# Patient Record
Sex: Male | Born: 1955 | Race: White | Hispanic: No | Marital: Married | Smoking: Never smoker
Health system: Southern US, Community
[De-identification: ages and names within clinical notes are randomized; demographics above are authoritative.]

## PROBLEM LIST (undated history)

## (undated) DIAGNOSIS — J329 Chronic sinusitis, unspecified: Secondary | ICD-10-CM

## (undated) DIAGNOSIS — R011 Cardiac murmur, unspecified: Secondary | ICD-10-CM

## (undated) DIAGNOSIS — J45909 Unspecified asthma, uncomplicated: Secondary | ICD-10-CM

## (undated) DIAGNOSIS — Z87442 Personal history of urinary calculi: Secondary | ICD-10-CM

## (undated) HISTORY — PX: FUNCTIONAL ENDOSCOPIC SINUS SURGERY: SUR616

## (undated) HISTORY — PX: COLONOSCOPY: SHX174

## (undated) HISTORY — PX: TONSILLECTOMY: SUR1361

## (undated) HISTORY — PX: OTHER SURGICAL HISTORY: SHX169

## (undated) HISTORY — DX: Unspecified asthma, uncomplicated: J45.909

## (undated) HISTORY — PX: NASAL SEPTUM SURGERY: SHX37

---

## 2006-06-27 ENCOUNTER — Emergency Department: Payer: Self-pay | Admitting: Unknown Physician Specialty

## 2006-08-03 ENCOUNTER — Ambulatory Visit: Payer: Self-pay | Admitting: Gastroenterology

## 2013-11-08 ENCOUNTER — Ambulatory Visit: Payer: Self-pay | Admitting: Unknown Physician Specialty

## 2014-10-18 ENCOUNTER — Emergency Department: Payer: Self-pay | Admitting: Emergency Medicine

## 2018-06-22 DIAGNOSIS — M51369 Other intervertebral disc degeneration, lumbar region without mention of lumbar back pain or lower extremity pain: Secondary | ICD-10-CM | POA: Insufficient documentation

## 2018-06-22 DIAGNOSIS — M5136 Other intervertebral disc degeneration, lumbar region: Secondary | ICD-10-CM | POA: Insufficient documentation

## 2018-07-23 ENCOUNTER — Other Ambulatory Visit: Payer: Self-pay | Admitting: Orthopedic Surgery

## 2018-07-23 DIAGNOSIS — M5442 Lumbago with sciatica, left side: Principal | ICD-10-CM

## 2018-07-23 DIAGNOSIS — G8929 Other chronic pain: Secondary | ICD-10-CM

## 2018-07-23 DIAGNOSIS — M5136 Other intervertebral disc degeneration, lumbar region: Secondary | ICD-10-CM

## 2018-08-16 ENCOUNTER — Ambulatory Visit
Admission: RE | Admit: 2018-08-16 | Discharge: 2018-08-16 | Disposition: A | Discharge status: Home / Self Care | Payer: BLUE CROSS/BLUE SHIELD | Source: Ambulatory Visit | Attending: Orthopedic Surgery | Admitting: Orthopedic Surgery

## 2018-08-16 DIAGNOSIS — M5442 Lumbago with sciatica, left side: Secondary | ICD-10-CM | POA: Diagnosis present

## 2018-08-16 DIAGNOSIS — G8929 Other chronic pain: Secondary | ICD-10-CM | POA: Insufficient documentation

## 2018-08-16 DIAGNOSIS — M5136 Other intervertebral disc degeneration, lumbar region: Secondary | ICD-10-CM | POA: Diagnosis present

## 2018-08-16 DIAGNOSIS — M47816 Spondylosis without myelopathy or radiculopathy, lumbar region: Secondary | ICD-10-CM | POA: Diagnosis not present

## 2018-09-26 ENCOUNTER — Other Ambulatory Visit: Payer: Self-pay | Admitting: Physical Medicine and Rehabilitation

## 2018-09-26 DIAGNOSIS — M545 Low back pain, unspecified: Secondary | ICD-10-CM

## 2018-09-26 DIAGNOSIS — G8929 Other chronic pain: Secondary | ICD-10-CM

## 2018-10-02 ENCOUNTER — Ambulatory Visit
Admission: RE | Admit: 2018-10-02 | Discharge: 2018-10-02 | Disposition: A | Discharge status: Home / Self Care | Payer: BLUE CROSS/BLUE SHIELD | Source: Ambulatory Visit | Attending: Physical Medicine and Rehabilitation | Admitting: Physical Medicine and Rehabilitation

## 2018-10-02 DIAGNOSIS — G8929 Other chronic pain: Secondary | ICD-10-CM

## 2018-10-02 DIAGNOSIS — M545 Low back pain: Principal | ICD-10-CM

## 2018-10-02 MED ORDER — IOPAMIDOL (ISOVUE-M 200) INJECTION 41%
1.0000 mL | Freq: Once | INTRAMUSCULAR | Status: AC
  Administered 2018-10-02: 1 mL via EPIDURAL

## 2018-10-02 MED ORDER — METHYLPREDNISOLONE ACETATE 40 MG/ML INJ SUSP (RADIOLOG
120.0000 mg | Freq: Once | INTRAMUSCULAR | Status: AC
  Administered 2018-10-02: 120 mg via EPIDURAL

## 2018-10-02 NOTE — Discharge Instructions (Signed)

## 2019-05-09 DIAGNOSIS — R200 Anesthesia of skin: Secondary | ICD-10-CM | POA: Insufficient documentation

## 2019-05-23 DIAGNOSIS — M79601 Pain in right arm: Secondary | ICD-10-CM | POA: Insufficient documentation

## 2019-09-02 ENCOUNTER — Ambulatory Visit
Admission: RE | Admit: 2019-09-02 | Discharge: 2019-09-02 | Disposition: A | Discharge status: Home / Self Care | Payer: BC Managed Care – PPO | Source: Ambulatory Visit | Attending: Unknown Physician Specialty | Admitting: Unknown Physician Specialty

## 2019-09-02 ENCOUNTER — Other Ambulatory Visit: Payer: Self-pay

## 2019-09-02 ENCOUNTER — Other Ambulatory Visit: Payer: Self-pay | Admitting: Unknown Physician Specialty

## 2019-09-02 DIAGNOSIS — R05 Cough: Secondary | ICD-10-CM

## 2019-09-02 DIAGNOSIS — R059 Cough, unspecified: Secondary | ICD-10-CM

## 2019-09-04 ENCOUNTER — Ambulatory Visit: Payer: BC Managed Care – PPO | Attending: Internal Medicine

## 2019-09-04 DIAGNOSIS — Z20822 Contact with and (suspected) exposure to covid-19: Secondary | ICD-10-CM

## 2019-09-05 LAB — NOVEL CORONAVIRUS, NAA: SARS-CoV-2, NAA: NOT DETECTED

## 2019-10-01 ENCOUNTER — Ambulatory Visit: Payer: BC Managed Care – PPO | Attending: Internal Medicine

## 2019-10-01 DIAGNOSIS — Z20822 Contact with and (suspected) exposure to covid-19: Secondary | ICD-10-CM

## 2019-10-02 LAB — NOVEL CORONAVIRUS, NAA: SARS-CoV-2, NAA: NOT DETECTED

## 2019-11-14 ENCOUNTER — Other Ambulatory Visit: Payer: Self-pay

## 2019-11-14 ENCOUNTER — Other Ambulatory Visit
Admission: RE | Admit: 2019-11-14 | Discharge: 2019-11-14 | Disposition: A | Discharge status: Home / Self Care | Payer: BC Managed Care – PPO | Attending: Internal Medicine | Admitting: Internal Medicine

## 2019-11-14 ENCOUNTER — Encounter: Payer: Self-pay | Admitting: Internal Medicine

## 2019-11-14 ENCOUNTER — Ambulatory Visit (INDEPENDENT_AMBULATORY_CARE_PROVIDER_SITE_OTHER): Payer: BC Managed Care – PPO | Admitting: Internal Medicine

## 2019-11-14 VITALS — BP 156/80 | HR 100 | Temp 98.7°F | Ht 66.0 in | Wt 147.2 lb

## 2019-11-14 DIAGNOSIS — J309 Allergic rhinitis, unspecified: Secondary | ICD-10-CM | POA: Insufficient documentation

## 2019-11-14 DIAGNOSIS — H1010 Acute atopic conjunctivitis, unspecified eye: Secondary | ICD-10-CM

## 2019-11-14 DIAGNOSIS — J4540 Moderate persistent asthma, uncomplicated: Secondary | ICD-10-CM

## 2019-11-14 LAB — CBC WITH DIFFERENTIAL/PLATELET
Abs Immature Granulocytes: 0.06 10*3/uL (ref 0.00–0.07)
Basophils Absolute: 0.1 10*3/uL (ref 0.0–0.1)
Basophils Relative: 1 %
Eosinophils Absolute: 0.6 10*3/uL — ABNORMAL HIGH (ref 0.0–0.5)
Eosinophils Relative: 8 %
HCT: 43.3 % (ref 39.0–52.0)
Hemoglobin: 14.8 g/dL (ref 13.0–17.0)
Immature Granulocytes: 1 %
Lymphocytes Relative: 25 %
Lymphs Abs: 1.9 10*3/uL (ref 0.7–4.0)
MCH: 30 pg (ref 26.0–34.0)
MCHC: 34.2 g/dL (ref 30.0–36.0)
MCV: 87.8 fL (ref 80.0–100.0)
Monocytes Absolute: 0.6 10*3/uL (ref 0.1–1.0)
Monocytes Relative: 8 %
Neutro Abs: 4.4 10*3/uL (ref 1.7–7.7)
Neutrophils Relative %: 57 %
Platelets: 281 10*3/uL (ref 150–400)
RBC: 4.93 MIL/uL (ref 4.22–5.81)
RDW: 12.3 % (ref 11.5–15.5)
WBC: 7.7 10*3/uL (ref 4.0–10.5)
nRBC: 0 % (ref 0.0–0.2)

## 2019-11-14 MED ORDER — IPRATROPIUM BROMIDE 0.03 % NA SOLN: 2.0000 | Freq: Four times a day (QID) | NASAL | 1 refills | Status: DC

## 2019-11-14 MED ORDER — MONTELUKAST SODIUM 10 MG PO TABS: 10.0000 mg | ORAL_TABLET | Freq: Every day | ORAL | 2 refills | Status: DC

## 2019-11-14 NOTE — Progress Notes (Signed)
Name: Micheal Carr MRN: [id number] DOB: 02-24-1956     CONSULTATION DATE: 11/14/2019  REFERRING MD : Kary Kos  CHIEF COMPLAINT: cough  STUDIES:     CXR independently reviewed by Me 09/02/2019 No acute process No pneumonia No opacifications no effusions    HISTORY OF PRESENT ILLNESS: 64 year old pleasant white male seen today for signs symptoms of moderate persistent asthma Patient has had chronic sinus infections his whole life Patient also has chronic allergic rhinitis Status post nasal surgery 5 years ago Patient averaged 2-5 chronic sinus infections per year Currently he is down to 2 infections per year  In the last 3 months patient has had 3 rounds of antibiotics and 2 rounds of prednisone Patient has nocturnal wheezing Patient has nocturnal cough Albuterol helps a little Patient uses Vicks VapoRub that helps as well as cough medicine  Patient has restarted his Symbicort inhaler which has helped his symptoms At this time he still has chronic persistent allergic rhinitis and symptoms  10 years ago he used to take Zyrtec which has helped to some numbness tremendously Patient stopped taking Zyrtec in fear of developing Alzheimer's  No ASTHMA exacerbation at this time No evidence of heart failure at this time No evidence or signs of infection at this time No respiratory distress No fevers, chills, nausea, vomiting, diarrhea No evidence of lower extremity edema No evidence hemoptysis  No indication for prednisone No indication for antibiotics at this time   PAST MEDICAL HISTORY :   has a past medical history of Asthma.  has no past surgical history on file. Prior to Admission medications   Medication Sig Start Date End Date Taking? Authorizing Provider  albuterol (VENTOLIN HFA) 108 (90 Base) MCG/ACT inhaler Inhale into the lungs. 06/06/18  Yes [provider]  ascorbic Acid (VITAMIN C) 500 MG CPCR Take by mouth.   Yes [provider]    budesonide-formoterol (SYMBICORT) 160-4.5 MCG/ACT inhaler    Yes [provider]  Fluticasone Propionate Truett Perna) 93 MCG/ACT [place]  10/03/19  Yes [provider]  Multiple Vitamin (MULTI-VITAMIN) tablet Take by mouth.   Yes [provider]  omeprazole (PRILOSEC) 40 MG capsule Take by mouth. 04/13/14  Yes [provider]  pseudoephedrine (SUDAFED) 120 MG 12 hr tablet Take by mouth.   Yes [provider]   Allergies  Allergen Reactions  . Levofloxacin Other (See Comments)    Patient states his ENT gave him this med and he had "a little something kind of reaction" and was told not to take it again.   . Sulfa Antibiotics Itching    FAMILY HISTORY:  family history includes Alzheimer's disease in his mother; Cancer in his mother; Dementia in his father; Heart disease in his mother; Kidney disease in his mother; Stroke in his father. SOCIAL HISTORY:  reports that he has never smoked. He has never used smokeless tobacco.    Review of Systems:  Gen:  Denies  fever, sweats, chills weigh loss  HEENT: Denies blurred vision, double vision, ear pain, eye pain, hearing loss, nose bleeds, sore throat Cardiac:  No dizziness, chest pain or heaviness, chest tightness,edema, No JVD Resp:   No cough, -sputum production, -shortness of breath,-wheezing, -hemoptysis,  Gi: Denies swallowing difficulty, stomach pain, nausea or vomiting, diarrhea, constipation, bowel incontinence Gu:  Denies bladder incontinence, burning urine Ext:   Denies Joint pain, stiffness or swelling Skin: Denies  skin rash, easy bruising or bleeding or hives Endoc:  Denies polyuria, polydipsia , polyphagia or  weight change Psych:   Denies depression, insomnia or hallucinations  Other:  All other systems negative   BP (!) 156/80 (BP Location: Left Arm, Cuff Size: Large)   Pulse 100   Temp 98.7 F (37.1 C) (Temporal)   Ht 5\' 6"  (1.676 m)   Wt 147 lb 3.2 oz (66.8 kg)   SpO2 98%   BMI  23.76 kg/m      SpO2: 98 % O2 Device: None (Room air)    Physical Examination:   GENERAL:NAD, no fevers, chills, no weakness no fatigue HEAD: Normocephalic, atraumatic.  EYES: PERLA, EOMI No scleral icterus.  MOUTH: Moist mucosal membrane.  EAR, NOSE, THROAT: Clear without exudates. No external lesions.  NECK: Supple.  PULMONARY: CTA B/L no wheezing, rhonchi, crackles CARDIOVASCULAR: S1 and S2. Regular rate and rhythm. No murmurs GASTROINTESTINAL: Soft, nontender, nondistended. Positive bowel sounds.  MUSCULOSKELETAL: No swelling, clubbing, or edema.  NEUROLOGIC: No gross focal neurological deficits. 5/5 strength all extremities SKIN: No ulceration, lesions, rashes, or cyanosis.  PSYCHIATRIC: Insight, judgment intact. -depression -anxiety ALL OTHER ROS ARE NEGATIVE   MEDICATIONS: I have reviewed all medications and confirmed regimen as documented      ASSESSMENT AND PLAN SYNOPSIS 64 year old pleasant white male seen today for chronic allergic rhinitis with moderate persistent asthma with ongoing symptoms with a history of chronic sinus infections status post nasal surgery  At this time I do believe he has moderate persistent asthma that is causing his symptoms most likely related to uncontrolled chronic allergic rhinitis I had a discussion about restarting antihistamines with Zyrtec as this has helped tremendously in the past Patient states he is afraid of developing Alzheimer's disease from chronic usage I have recommended that starting Zyrtec may be the best option at this time and will also continue Symbicort as well as starting Singulair and using Atrovent nasal sprays at this time No indication for prednisone or antibiotics at this time I will also check CBC and IgE levels to assess for immunological therapy   TOTAL TIME 42 mins  COVID-19 EDUCATION: The signs and symptoms of COVID-19 were discussed with the patient and how to seek care for testing.  The  importance of social distancing was discussed today. Hand Washing Techniques and avoid touching face was advised.     MEDICATION ADJUSTMENTS/LABS AND TESTS ORDERED: Continue Symbicort as prescribed Start Singulair as prescribed Start Atrovent nasal sprays use as directed Check CBC and eosinophil levels and IgE levels  I ALSO ADVISE TO RESTART ZYRTEC   CURRENT MEDICATIONS REVIEWED AT LENGTH WITH PATIENT TODAY   Patient satisfied with Plan of action and management. All questions answered  Follow up in 6 months   Sherida Dobkins 68, M.D.  [facility]  Medical Director [facility] Medical Director [facility] Cardio-Pulmonary Department

## 2019-11-14 NOTE — Patient Instructions (Addendum)
Continue Symbicort as prescribed Start Singulair as prescribed Start Atrovent nasal sprays use as directed Check CBC and eosinophil levels and IgE levels  I ALSO ADVISE TO RESTART ZYRTEC

## 2019-11-15 ENCOUNTER — Other Ambulatory Visit: Payer: Self-pay | Admitting: Internal Medicine

## 2019-11-15 DIAGNOSIS — H1010 Acute atopic conjunctivitis, unspecified eye: Secondary | ICD-10-CM

## 2019-11-15 DIAGNOSIS — J309 Allergic rhinitis, unspecified: Secondary | ICD-10-CM

## 2019-12-24 ENCOUNTER — Encounter: Payer: Self-pay | Admitting: Adult Health

## 2019-12-24 ENCOUNTER — Other Ambulatory Visit: Payer: Self-pay

## 2019-12-24 ENCOUNTER — Ambulatory Visit (INDEPENDENT_AMBULATORY_CARE_PROVIDER_SITE_OTHER): Payer: BC Managed Care – PPO | Admitting: Adult Health

## 2019-12-24 ENCOUNTER — Ambulatory Visit (INDEPENDENT_AMBULATORY_CARE_PROVIDER_SITE_OTHER): Payer: BC Managed Care – PPO

## 2019-12-24 DIAGNOSIS — J4540 Moderate persistent asthma, uncomplicated: Secondary | ICD-10-CM | POA: Diagnosis not present

## 2019-12-24 DIAGNOSIS — J45909 Unspecified asthma, uncomplicated: Secondary | ICD-10-CM | POA: Insufficient documentation

## 2019-12-24 DIAGNOSIS — J329 Chronic sinusitis, unspecified: Secondary | ICD-10-CM

## 2019-12-24 MED ORDER — BENZONATATE 200 MG PO CAPS: 200.0000 mg | ORAL_CAPSULE | Freq: Three times a day (TID) | ORAL | 1 refills | Status: DC | PRN

## 2019-12-24 MED ORDER — ALBUTEROL SULFATE (2.5 MG/3ML) 0.083% IN NEBU: 2.5000 mg | INHALATION_SOLUTION | Freq: Four times a day (QID) | RESPIRATORY_TRACT | 4 refills | Status: DC | PRN

## 2019-12-24 NOTE — Assessment & Plan Note (Signed)
Finish abx and steroids Add saline rinses.  Follow up with ENT

## 2019-12-24 NOTE — Addendum Note (Signed)
Addended by: Maurene Capes on: 12/24/2019 04:02 PM   Modules accepted: Orders

## 2019-12-24 NOTE — Assessment & Plan Note (Signed)
Moderate allergic Asthma - suspect allergic component with elevated eosinophils.  Check IgE  Control for triggers -CR /GERD  Check PFT -if restrictive process,consider HRCT chest  Check cxr today .   Plan  Patient Instructions  Begin Saline nasal rinses Twice daily   Continue on Xhance Nasal  Twice daily  .  Continue on Singulair 10mg  At bedtime   Continue on Symbicort 2 puffs Twice daily   Albuterol inhaler As needed  .  Albuterol Neb every 4-6hrs As needed   Mucinex Twice daily  As needed  Cough/congestion  Delsym 2 tsp Twice daily  For cough .  Tessalon Three times a day   Prilosec 20mg  daily  Add Pepcid 20mg  At bedtime .  Labs today .  Chest xray .  Sputum culture.  Follow up in 3 weeks with PFT and As needed   Please contact office for sooner follow up if symptoms do not improve or worsen or seek emergency care

## 2019-12-24 NOTE — Patient Instructions (Addendum)
Begin Saline nasal rinses Twice daily   Continue on Xhance Nasal  Twice daily  .  Continue on Singulair 10mg  At bedtime   Continue on Symbicort 2 puffs Twice daily   Albuterol inhaler As needed  .  Albuterol Neb every 4-6hrs As needed   Mucinex Twice daily  As needed  Cough/congestion  Delsym 2 tsp Twice daily  For cough .  Tessalon Three times a day   Prilosec 20mg  daily  Add Pepcid 20mg  At bedtime .  Labs today .  Chest xray .  Sputum culture.  Follow up in 3 weeks with PFT and As needed   Please contact office for sooner follow up if symptoms do not improve or worsen or seek emergency care

## 2019-12-24 NOTE — Progress Notes (Signed)
@Patient  ID: Micheal Carr, male    DOB: [birth year], 64 y.o.   MRN: [id number]  Chief Complaint  Patient presents with  . Follow-up    Asthma     Referring provider: [place] Pink, MD  HPI: 64 year old male seen for pulmonary consult November 14, 2019 for moderate persistent asthma, chronic rhinitis Dx with Asthma in 2015.  Medical history significant for chronic sinusitis status post sinus surgery (~2015)    TEST/EVENTS :   12/24/2019  4 months of ongoing cough, congestion , sinus pressure , drainage. Had seen PCP and ENT without much improvement. Referred to Pulmonary 11/14/19 Dr. Mortimer Fries. He was recommended to continue on Symbicort.  He was started on Singulair, Zyrtec and Atrovent nasal spray.  Lab work showed elevated eosinophils absolute count at 600.  IgE was ordered but not completed.Marland Kitchen He is not feeling any better. Has taken 4 different courses of antibiotics and steroids without improvement.  Continues to cough up thick mucus ,  Following with ENT , started on clindamycin and prednisone taper. Still has ongoing symptoms  Started on allergy shots last week.  Has not received covid vaccines.  Worse symptom chest tightness, wheezing , dyspnea, restless sleep.  No chest pain .  Very active -does chores with no issue.   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  Never smoker .  No hottub , basement . No down comforter.  No unusual hobbies, no travel.  From [place] .   Chest xray shows clear lungs in 11/2019.   Episode 25 years ago inhaled inflatable pool air that caused irriatation in lung,    FH - PVD .    Allergies  Allergen Reactions  . Levofloxacin Other (See Comments)    Patient states his ENT gave him this med and he had "a little something kind of reaction" and was told not to take it again.   . Sulfa Antibiotics Itching     There is no immunization history on file for this patient.  Past  Medical History:  Diagnosis Date  . Asthma     Tobacco History: Social History   Tobacco Use  Smoking Status Never Smoker  Smokeless Tobacco Never Used   Counseling given: Not Answered   Outpatient Medications Prior to Visit  Medication Sig Dispense Refill  . albuterol (VENTOLIN HFA) 108 (90 Base) MCG/ACT inhaler Inhale into the lungs.    Marland Kitchen ascorbic Acid (VITAMIN C) 500 MG CPCR Take by mouth.    . budesonide-formoterol (SYMBICORT) 160-4.5 MCG/ACT inhaler     . chlorpheniramine-HYDROcodone (TUSSIONEX) 10-8 MG/5ML SUER Take 5 mLs by mouth.    . clindamycin (CLEOCIN) 300 MG capsule Take 300 mg by mouth 3 (three) times daily.    . Fluticasone Propionate (XHANCE) 93 MCG/ACT EXHU     . ipratropium (ATROVENT) 0.03 % nasal spray PLACE 2 SPRAYS INTO THE NOSE 4 (FOUR) TIMES DAILY. 24 mL 11  . montelukast (SINGULAIR) 10 MG tablet Take 1 tablet (10 mg total) by mouth daily. 30 tablet 2  . Multiple Vitamin (MULTI-VITAMIN) tablet Take by mouth.    Marland Kitchen omeprazole (PRILOSEC) 40 MG capsule Take by mouth.    . predniSONE (DELTASONE) 10 MG tablet Take 10 mg by mouth daily with breakfast.    . pseudoephedrine (SUDAFED) 120 MG 12 hr tablet Take by mouth.     No facility-administered medications prior to visit.     Review of Systems:   Constitutional:  No  weight loss, night sweats,  Fevers, chills, fatigue, or  lassitude.  HEENT:   No headaches,  Difficulty swallowing,  Tooth/dental problems, or  Sore throat,                No sneezing, itching, ear ache,  +nasal congestion, post nasal drip,   CV:  No chest pain,  Orthopnea, PND, swelling in lower extremities, anasarca, dizziness, palpitations, syncope.   GI  No heartburn, indigestion, abdominal pain, nausea, vomiting, diarrhea, change in bowel habits, loss of appetite, bloody stools.   Resp:    No chest wall deformity  Skin: no rash or lesions.  GU: no dysuria, change in color of urine, no urgency or frequency.  No flank pain, no  hematuria   MS:  No joint pain or swelling.  No decreased range of motion.  No back pain.    Physical Exam  BP 140/82 (BP Location: Left Arm, Patient Position: Sitting, Cuff Size: Normal)   Pulse 97   Temp 97.9 F (36.6 C) (Temporal)   Ht 5\' 6"  (1.676 m)   Wt 147 lb 9.6 oz (67 kg)   SpO2 95% Comment: room air  BMI 23.82 kg/m   GEN: A/Ox3; pleasant , NAD, well nourished    HEENT:  [place]/AT,  , NOSE-clear, THROAT-clear, no lesions, no postnasal drip or exudate noted.   NECK:  Supple w/ fair ROM; no JVD; normal carotid impulses w/o bruits; no thyromegaly or nodules palpated; no lymphadenopathy.    RESP  Clear  P & A; w/o, wheezes/ rales/ or rhonchi. no accessory muscle use, no dullness to percussion  CARD:  RRR, no m/r/g, no peripheral edema, pulses intact, no cyanosis or clubbing.  GI:   Soft & nt; nml bowel sounds; no organomegaly or masses detected.   Musco: Warm bil, no deformities or joint swelling noted.   Neuro: alert, no focal deficits noted.    Skin: Warm, no lesions or rashes    Lab Results:  CBC    Component Value Date/Time   WBC 7.7 11/14/2019 1436   RBC 4.93 11/14/2019 1436   HGB 14.8 11/14/2019 1436   HCT 43.3 11/14/2019 1436   PLT 281 11/14/2019 1436   MCV 87.8 11/14/2019 1436   MCH 30.0 11/14/2019 1436   MCHC 34.2 11/14/2019 1436   RDW 12.3 11/14/2019 1436   LYMPHSABS 1.9 11/14/2019 1436   MONOABS 0.6 11/14/2019 1436   EOSABS 0.6 (H) 11/14/2019 1436   BASOSABS 0.1 11/14/2019 1436    BMET No results found for: NA, K, CL, CO2, GLUCOSE, BUN, CREATININE, CALCIUM, GFRNONAA, GFRAA  BNP No results found for: BNP  ProBNP No results found for: PROBNP  Imaging: No results found.    No flowsheet data found.  No results found for: NITRICOXIDE      Assessment & Plan:   Asthma Moderate allergic Asthma - suspect allergic component with elevated eosinophils.  Check IgE  Control for triggers -CR /GERD  Check PFT -if restrictive  process,consider HRCT chest  Check cxr today .   Plan  Patient Instructions  Begin Saline nasal rinses Twice daily   Continue on Xhance Nasal  Twice daily  .  Continue on Singulair 10mg  At bedtime   Continue on Symbicort 2 puffs Twice daily   Albuterol inhaler As needed  .  Albuterol Neb every 4-6hrs As needed   Mucinex Twice daily  As needed  Cough/congestion  Delsym 2 tsp Twice daily  For cough .  Tessalon Three  times a day   Prilosec 20mg  daily  Add Pepcid 20mg  At bedtime .  Labs today .  Chest xray .  Sputum culture.  Follow up in 3 weeks with PFT and As needed   Please contact office for sooner follow up if symptoms do not improve or worsen or seek emergency care          Chronic sinusitis Finish abx and steroids Add saline rinses.  Follow up with ENT       , NP 12/24/2019

## 2020-01-02 ENCOUNTER — Other Ambulatory Visit
Admission: RE | Admit: 2020-01-02 | Discharge: 2020-01-02 | Disposition: A | Discharge status: Home / Self Care | Payer: BC Managed Care – PPO | Source: Ambulatory Visit | Attending: Adult Health | Admitting: Adult Health

## 2020-01-02 DIAGNOSIS — J4540 Moderate persistent asthma, uncomplicated: Secondary | ICD-10-CM | POA: Insufficient documentation

## 2020-01-02 LAB — EXPECTORATED SPUTUM ASSESSMENT W GRAM STAIN, RFLX TO RESP C

## 2020-01-04 LAB — CULTURE, RESPIRATORY W GRAM STAIN: Culture: NORMAL

## 2020-01-07 ENCOUNTER — Other Ambulatory Visit: Payer: Self-pay | Admitting: Internal Medicine

## 2020-01-07 DIAGNOSIS — H1010 Acute atopic conjunctivitis, unspecified eye: Secondary | ICD-10-CM

## 2020-01-07 DIAGNOSIS — J309 Allergic rhinitis, unspecified: Secondary | ICD-10-CM

## 2020-01-29 ENCOUNTER — Other Ambulatory Visit: Payer: Self-pay

## 2020-01-29 ENCOUNTER — Other Ambulatory Visit
Admission: RE | Admit: 2020-01-29 | Discharge: 2020-01-29 | Disposition: A | Discharge status: Home / Self Care | Payer: BC Managed Care – PPO | Source: Ambulatory Visit | Attending: Internal Medicine | Admitting: Internal Medicine

## 2020-01-29 DIAGNOSIS — Z20822 Contact with and (suspected) exposure to covid-19: Secondary | ICD-10-CM | POA: Insufficient documentation

## 2020-01-29 DIAGNOSIS — Z01812 Encounter for preprocedural laboratory examination: Secondary | ICD-10-CM | POA: Insufficient documentation

## 2020-01-29 LAB — SARS CORONAVIRUS 2 (TAT 6-24 HRS): SARS Coronavirus 2: NEGATIVE

## 2020-01-31 ENCOUNTER — Encounter: Payer: Self-pay | Admitting: Adult Health

## 2020-01-31 ENCOUNTER — Ambulatory Visit (INDEPENDENT_AMBULATORY_CARE_PROVIDER_SITE_OTHER): Payer: BC Managed Care – PPO | Admitting: Adult Health

## 2020-01-31 ENCOUNTER — Other Ambulatory Visit: Payer: Self-pay

## 2020-01-31 VITALS — BP 120/76 | HR 92 | Temp 98.5°F | Ht 65.0 in | Wt 149.5 lb

## 2020-01-31 DIAGNOSIS — J4540 Moderate persistent asthma, uncomplicated: Secondary | ICD-10-CM | POA: Diagnosis not present

## 2020-01-31 DIAGNOSIS — J329 Chronic sinusitis, unspecified: Secondary | ICD-10-CM

## 2020-01-31 LAB — PULMONARY FUNCTION TEST
DL/VA % pred: 122 %
DL/VA: 5.23 ml/min/mmHg/L
DLCO unc % pred: 112 %
DLCO unc: 26.7 ml/min/mmHg
FEF 25-75 Post: 3.66 L/sec
FEF 25-75 Pre: 3.51 L/sec
FEF2575-%Change-Post: 4 %
FEF2575-%Pred-Post: 148 %
FEF2575-%Pred-Pre: 142 %
FEV1-%Change-Post: 0 %
FEV1-%Pred-Post: 104 %
FEV1-%Pred-Pre: 103 %
FEV1-Post: 3.12 L
FEV1-Pre: 3.1 L
FEV1FVC-%Change-Post: 3 %
FEV1FVC-%Pred-Pre: 110 %
FEV6-%Change-Post: -2 %
FEV6-%Pred-Post: 96 %
FEV6-%Pred-Pre: 99 %
FEV6-Post: 3.64 L
FEV6-Pre: 3.73 L
FEV6FVC-%Pred-Post: 105 %
FEV6FVC-%Pred-Pre: 105 %
FVC-%Change-Post: -2 %
FVC-%Pred-Post: 91 %
FVC-%Pred-Pre: 93 %
FVC-Post: 3.64 L
FVC-Pre: 3.73 L
Post FEV1/FVC ratio: 86 %
Post FEV6/FVC ratio: 100 %
Pre FEV1/FVC ratio: 83 %
Pre FEV6/FVC Ratio: 100 %

## 2020-01-31 NOTE — Progress Notes (Signed)
@Patient  ID: , male    DOB: 06/05/1956, 64 y.o.   MRN: [id number]  Chief Complaint  Patient presents with  . 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  Never smoker .  No hottub , basement . No down comforter.  No unusual hobbies, no travel.  From [place] .   Chest xray shows clear lungs in 11/2019.  Eosinophils elevated at 600  01/31/2020 Follow up : Asthma  Patient returns for a 6-week follow-up.  Patient says overall breathing has been doing much better.  Patient says shortly after seen last time he started feeling better breathing is starting to improve with decreased cough and shortness of breath He remains on Symbicort twice daily.  He is on daily Singulair Zyrtec and Atrovent nasal spray. Patient was set up for pulmonary function testing that was done today that shows normal lung function with FEV1 at 104%, ratio 86, FVC 91%, no significant bronchodilator response, DLCO 112% Has rare use of his albuterol inhaler.  Follows with ENT for chronic sinus infections. Feeling better since finishing prolonged antibiotics.  Activity level has improved.  He is returning more close to his baseline.  Allergies  Allergen Reactions  . Levofloxacin Other (See Comments)    Patient states his ENT gave him this med and he had "a little something kind of reaction" and was told not to take it again.   . Sulfa Antibiotics Itching     There is no immunization history on file for this patient.  Past Medical History:  Diagnosis Date  . Asthma     Tobacco History: Social History   Tobacco Use  Smoking Status  Never Smoker  Smokeless Tobacco Never Used   Counseling given: Not Answered   Outpatient Medications Prior to Visit  Medication Sig Dispense Refill  . albuterol (VENTOLIN HFA) 108 (90 Base) MCG/ACT inhaler Inhale into the lungs.    04/01/2020 ascorbic Acid (VITAMIN C) 500 MG CPCR Take 1,000 mg by mouth 2 (two) times daily.     . budesonide-formoterol (SYMBICORT) 160-4.5 MCG/ACT inhaler Inhale 2 puffs into the lungs in the morning and at bedtime.     . cetirizine (ZYRTEC) 10 MG tablet Take 10 mg by mouth at bedtime.    . Cholecalciferol (VITAMIN D3 PO) Take 1 capsule by mouth 2 (two) times daily.    . montelukast (SINGULAIR) 10 MG tablet Take 1 tablet (10 mg total) by mouth daily. 30 tablet 2  . Multiple Vitamin (MULTI-VITAMIN) tablet Take 1 tablet by mouth daily.     Marland Kitchen omeprazole (PRILOSEC) 40 MG capsule Take 40 mg by mouth daily.     . pseudoephedrine (SUDAFED) 120 MG 12 hr tablet Take 120 mg by mouth at bedtime.      No facility-administered medications prior to visit.     Review of Systems:   Constitutional:   No  weight loss, night sweats,  Fevers, chills, fatigue, or  lassitude.  HEENT:   No headaches,  Difficulty swallowing,  Tooth/dental problems, or  Sore throat,                No sneezing, itching, ear  ache, +nasal congestion, post nasal drip,   CV:  No chest pain,  Orthopnea, PND, swelling in lower extremities, anasarca, dizziness, palpitations, syncope.   GI  No heartburn, indigestion, abdominal pain, nausea, vomiting, diarrhea, change in bowel habits, loss of appetite, bloody stools.   Resp:    No chest wall deformity  Skin: no rash or lesions.  GU: no dysuria, change in color of urine, no urgency or frequency.  No flank pain, no hematuria   MS:  No joint pain or swelling.  No decreased range of motion.  No back pain.    Physical Exam  BP 120/76 (BP Location: Left Arm, Cuff Size: Normal)   Pulse 92   Temp 98.5 F (36.9 C) (Oral)   Ht 5\' 5"  (1.651 m)   Wt 149 lb 8  oz (67.8 kg)   SpO2 97%   BMI 24.88 kg/m   GEN: A/Ox3; pleasant , NAD, well nourished    HEENT:  [place]/AT,   NOSE-clear, THROAT-clear, no lesions, no postnasal drip or exudate noted.   NECK:  Supple w/ fair ROM; no JVD; normal carotid impulses w/o bruits; no thyromegaly or nodules palpated; no lymphadenopathy.    RESP  Clear  P & A; w/o, wheezes/ rales/ or rhonchi. no accessory muscle use, no dullness to percussion  CARD:  RRR, no m/r/g, no peripheral edema, pulses intact, no cyanosis or clubbing.  GI:   Soft & nt; nml bowel sounds; no organomegaly or masses detected.   Musco: Warm bil, no deformities or joint swelling noted.   Neuro: alert, no focal deficits noted.    Skin: Warm, no lesions or rashes     BMET No results found for: NA, K, CL, CO2, GLUCOSE, BUN, CREATININE, CALCIUM, GFRNONAA, GFRAA  BNP No results found for: BNP  ProBNP No results found for: PROBNP  Imaging: No results found.    No results found for: NITRICOXIDE      Assessment & Plan:   Asthma Moderate persistent asthma with allergic component recent flare now improved.  Patient's continue on trigger prevention.  Continue on aggressive maintenance regimen.  Pulmonary function testing today shows normal lung function.  Check IgE today.  Plan  . Patient Instructions  Labs with IgE today .  Saline nasal rinses Twice daily   Continue on Xhance Nasal  Twice daily  .  Continue on Singulair 10mg  At bedtime   Continue on Symbicort 2 puffs Twice daily   Albuterol inhaler As needed  .  Albuterol Neb every 4-6hrs As needed   Mucinex Twice daily  As needed  Cough/congestion  Delsym 2 tsp Twice daily  For cough . As needed   Tessalon Three times a day  As needed  Cough  Prilosec 20mg  daily  Zyrtec 10mg  At bedtime   Follow up with Dr. Mortimer Fries at [facility] office in 2-3 months and As needed   Please contact office for sooner follow up if symptoms do not improve or worsen or seek emergency care           Chronic sinusitis Continue on maintenance regimen.  Follow-up with ENT as planned     Rexene Edison, NP 01/31/2020

## 2020-01-31 NOTE — Assessment & Plan Note (Signed)
Continue on maintenance regimen.  Follow-up with ENT as planned

## 2020-01-31 NOTE — Assessment & Plan Note (Signed)
Moderate persistent asthma with allergic component recent flare now improved.  Patient's continue on trigger prevention.  Continue on aggressive maintenance regimen.  Pulmonary function testing today shows normal lung function.  Check IgE today.  Plan  . Patient Instructions  Labs with IgE today .  Saline nasal rinses Twice daily   Continue on Xhance Nasal  Twice daily  .  Continue on Singulair 10mg  At bedtime   Continue on Symbicort 2 puffs Twice daily   Albuterol inhaler As needed  .  Albuterol Neb every 4-6hrs As needed   Mucinex Twice daily  As needed  Cough/congestion  Delsym 2 tsp Twice daily  For cough . As needed   Tessalon Three times a day  As needed  Cough  Prilosec 20mg  daily  Zyrtec 10mg  At bedtime   Follow up with Dr. at [facility] office in 2-3 months and As needed   Please contact office for sooner follow up if symptoms do not improve or worsen or seek emergency care

## 2020-01-31 NOTE — Patient Instructions (Addendum)
Labs with IgE today .  Saline nasal rinses Twice daily   Continue on Xhance Nasal  Twice daily  .  Continue on Singulair 10mg  At bedtime   Continue on Symbicort 2 puffs Twice daily   Albuterol inhaler As needed  .  Albuterol Neb every 4-6hrs As needed   Mucinex Twice daily  As needed  Cough/congestion  Delsym 2 tsp Twice daily  For cough . As needed   Tessalon Three times a day  As needed  Cough  Prilosec 20mg  daily  Zyrtec 10mg  At bedtime   Follow up with Dr. at [facility] office in 2-3 months and As needed   Please contact office for sooner follow up if symptoms do not improve or worsen or seek emergency care

## 2020-01-31 NOTE — Progress Notes (Signed)
PFT completed today.  

## 2020-02-03 LAB — IGE: IgE (Immunoglobulin E), Serum: 34 kU/L (ref <=114)

## 2020-02-25 ENCOUNTER — Other Ambulatory Visit: Payer: Self-pay | Admitting: Internal Medicine

## 2020-02-25 DIAGNOSIS — J309 Allergic rhinitis, unspecified: Secondary | ICD-10-CM

## 2020-02-25 DIAGNOSIS — H1010 Acute atopic conjunctivitis, unspecified eye: Secondary | ICD-10-CM

## 2020-04-09 ENCOUNTER — Ambulatory Visit (INDEPENDENT_AMBULATORY_CARE_PROVIDER_SITE_OTHER): Payer: BC Managed Care – PPO | Admitting: Urology

## 2020-04-09 ENCOUNTER — Other Ambulatory Visit: Payer: Self-pay

## 2020-04-09 ENCOUNTER — Encounter: Payer: Self-pay | Admitting: Urology

## 2020-04-09 VITALS — BP 163/80 | HR 99 | Temp 98.0°F | Ht 65.0 in | Wt 149.0 lb

## 2020-04-09 DIAGNOSIS — R972 Elevated prostate specific antigen [PSA]: Secondary | ICD-10-CM

## 2020-04-09 DIAGNOSIS — N401 Enlarged prostate with lower urinary tract symptoms: Secondary | ICD-10-CM | POA: Diagnosis not present

## 2020-04-09 MED ORDER — TAMSULOSIN HCL 0.4 MG PO CAPS: 0.4000 mg | ORAL_CAPSULE | Freq: Every day | ORAL | 1 refills | Status: DC

## 2020-04-09 NOTE — Progress Notes (Signed)
04/09/2020 1:38 PM   Micheal Carr 12/06/1955 [id number]  Referring provider: Jerl Mina, MD [address] [facility] [place],  [place] [address]  Chief Complaint  Patient presents with  . Elevated PSA    HPI: Micheal Carr is a 64 y.o. male seen at the request of Dr. Burnett Sheng for evaluation of an elevated PSA.   PSA 03/27/2020 elevated 9.17  Prior PSA 03/2020 was 2.02  PSA trend as below  Several month history of increasing lower urinary tract symptoms including frequency, urgency, hesitancy and decreased urinary stream  IPSS  Urinalysis at time of PSA was unremarkable  Denies dysuria, gross hematuria  Has had noted 2 episodes in the last month of mild burning sensation towards the end of the penis and mild suprapubic burning sensation  Prior history of nephrolithiasis        PMH: Past Medical History:  Diagnosis Date  . Asthma     Surgical History: No past surgical history on file.  Home Medications:  Allergies as of 04/09/2020      Reactions   Levofloxacin Other (See Comments)   Patient states his ENT gave him this med and he had "a little something kind of reaction" and was told not to take it again.   Sulfa Antibiotics Itching      Medication List       Accurate as of April 09, 2020  1:38 PM. If you have any questions, ask your nurse or doctor.        albuterol 108 (90 Base) MCG/ACT inhaler Commonly known as: VENTOLIN HFA Inhale into the lungs.   ascorbic Acid 500 MG Cpcr Commonly known as: VITAMIN C Take 1,000 mg by mouth 2 (two) times daily.   cetirizine 10 MG tablet Commonly known as: ZYRTEC Take 10 mg by mouth at bedtime.   montelukast 10 MG tablet Commonly known as: SINGULAIR TAKE 1 TABLET BY MOUTH EVERY DAY   Multi-Vitamin tablet Take 1 tablet by mouth daily.   omeprazole 40 MG capsule Commonly known as: PRILOSEC Take 40 mg by mouth daily.   pseudoephedrine 120 MG 12 hr tablet Commonly known as:  SUDAFED Take 120 mg by mouth at bedtime.   Symbicort 160-4.5 MCG/ACT inhaler Generic drug: budesonide-formoterol Inhale 2 puffs into the lungs in the morning and at bedtime.   Vitamin D3 125 MCG (5000 UT) Caps Take by mouth. What changed: Another medication with the same name was removed. Continue taking this medication, and follow the directions you see here. Changed by: [name], MD   Charmayne Sheer MCG/ACT Exhu Generic drug: Fluticasone Propionate Place into the nose.       Allergies:  Allergies  Allergen Reactions  . Levofloxacin Other (See Comments)    Patient states his ENT gave him this med and he had "a little something kind of reaction" and was told not to take it again.   . Sulfa Antibiotics Itching    Family History: Family History  Problem Relation Age of Onset  . Alzheimer's disease Mother   . Kidney disease Mother   . Cancer Mother   . Heart disease Mother   . Dementia Father   . Stroke Father     Social History:  reports that he has never smoked. He has never used smokeless tobacco. No history on file for alcohol use and drug use.   Physical Exam: BP (!) 163/80   Pulse 99   Temp 98 F (36.7 C)   Ht  5\' 5"  (1.651 m)   Wt 149 lb (67.6 kg)   SpO2 99%   BMI 24.79 kg/m   Constitutional:  Alert and oriented, No acute distress. HEENT: [place] AT, moist mucus membranes.  Trachea midline, no masses. Cardiovascular: No clubbing, cyanosis, or edema. Respiratory: Normal respiratory effort, no increased work of breathing. GI: Abdomen is soft, nontender, nondistended, no abdominal masses GU: Phallus without lesions, testes descended bilaterally without masses or tenderness, spermatic cord/epididymis palpably normal bilaterally.  Prostate 50 g, smooth without nodules or induration Skin: No rashes, bruises or suspicious lesions. Neurologic: Grossly intact, no focal deficits, moving all 4 extremities. Psychiatric: Normal mood and affect.   Assessment & Plan:      1.  Elevated PSA  Although PSA is a prostate cancer screening test he was informed that cancer is not the most common cause of an elevated PSA. Other potential causes including BPH and inflammation were discussed. He was informed that the only way to adequately diagnose prostate cancer would be a transrectal ultrasound and biopsy of the prostate. The procedure was discussed including potential risks of bleeding and infection/sepsis. He was also informed that a negative biopsy does not conclusively rule out the possibility that prostate cancer may be present and that continued monitoring is required. The use of newer adjunctive blood tests including PHI and 4kScore were discussed. The use of multiparametric prostate MRI was also discussed however is not typically used for initial evaluation of an elevated PSA. Continued periodic surveillance was also discussed.  He has noted worsening lower urinary tract symptoms and mild pelvic discomfort and his PSA bump may be secondary to prostatic inflammation  Rx tamsulosin 0.4 mg daily x30 days  Repeat PSA 1 month  If PSA remains elevated he will think over management options discussed above  2.  BPH with LUTS  Will reassess symptoms after 30-day trial tamsulosin   , MD  [facility] [address], [address] [place], [place] [phone]

## 2020-04-10 ENCOUNTER — Encounter: Payer: Self-pay | Admitting: Urology

## 2020-05-05 ENCOUNTER — Other Ambulatory Visit: Payer: Self-pay | Admitting: Urology

## 2020-05-11 ENCOUNTER — Other Ambulatory Visit: Payer: BC Managed Care – PPO

## 2020-05-11 ENCOUNTER — Other Ambulatory Visit: Payer: Self-pay

## 2020-05-11 DIAGNOSIS — R972 Elevated prostate specific antigen [PSA]: Secondary | ICD-10-CM

## 2020-05-12 ENCOUNTER — Telehealth: Payer: Self-pay | Admitting: *Deleted

## 2020-05-12 LAB — PSA: Prostate Specific Ag, Serum: 6.4 ng/mL — ABNORMAL HIGH (ref 0.0–4.0)

## 2020-05-12 NOTE — Telephone Encounter (Signed)
-----   Message from [name], MD sent at 05/12/2020 12:52 PM EDT ----- PSA remains persistently elevated at 6.4.  Would recommend scheduling prostate biopsy.  If he does not desire to pursue biopsy would schedule follow-up office visit to discuss options further

## 2020-05-13 NOTE — Telephone Encounter (Signed)
Notified patient as instructed, He will do a prostate biopsy .

## 2020-05-29 ENCOUNTER — Other Ambulatory Visit: Payer: Self-pay | Admitting: Urology

## 2020-06-03 ENCOUNTER — Other Ambulatory Visit: Payer: Self-pay | Admitting: Urology

## 2020-06-04 ENCOUNTER — Other Ambulatory Visit: Payer: Self-pay | Admitting: Family Medicine

## 2020-06-04 MED ORDER — TAMSULOSIN HCL 0.4 MG PO CAPS: 0.4000 mg | ORAL_CAPSULE | Freq: Every day | ORAL | 0 refills | Status: DC

## 2020-06-10 NOTE — Progress Notes (Signed)
Prostate Biopsy Procedure   Informed consent was obtained after discussing risks/benefits of the procedure.  A time out was performed to ensure correct patient identity.  Pre-Procedure: - Last PSA Level: 6.4 on 05/11/2020 - Gentamicin given prophylactically - Levaquin 500 mg administered PO -Transrectal Ultrasound performed revealing a 39 gm prostate -No significant hypoechoic or median lobe noted  Procedure: - Prostate block performed using 10 cc 1% lidocaine and biopsies taken from sextant areas, a total of 12 under ultrasound guidance.  Post-Procedure: - Patient tolerated the procedure well - He was counseled to seek immediate medical attention if experiences any severe pain, significant bleeding, or fevers - Return in one week to discuss biopsy results   Irineo Axon, MD

## 2020-06-11 ENCOUNTER — Encounter: Payer: Self-pay | Admitting: Urology

## 2020-06-11 ENCOUNTER — Other Ambulatory Visit: Payer: Self-pay

## 2020-06-11 ENCOUNTER — Ambulatory Visit (INDEPENDENT_AMBULATORY_CARE_PROVIDER_SITE_OTHER): Payer: BC Managed Care – PPO | Admitting: Urology

## 2020-06-11 VITALS — BP 151/80 | HR 98 | Ht 65.0 in | Wt 145.8 lb

## 2020-06-11 DIAGNOSIS — N200 Calculus of kidney: Secondary | ICD-10-CM | POA: Insufficient documentation

## 2020-06-11 DIAGNOSIS — T7840XA Allergy, unspecified, initial encounter: Secondary | ICD-10-CM | POA: Insufficient documentation

## 2020-06-11 DIAGNOSIS — J302 Other seasonal allergic rhinitis: Secondary | ICD-10-CM | POA: Insufficient documentation

## 2020-06-11 DIAGNOSIS — R972 Elevated prostate specific antigen [PSA]: Secondary | ICD-10-CM | POA: Diagnosis not present

## 2020-06-11 DIAGNOSIS — K649 Unspecified hemorrhoids: Secondary | ICD-10-CM | POA: Insufficient documentation

## 2020-06-11 MED ORDER — CEFTRIAXONE SODIUM 500 MG IJ SOLR
1000.0000 mg | Freq: Once | INTRAMUSCULAR | Status: AC
  Administered 2020-06-11: 1000 mg via INTRAMUSCULAR

## 2020-06-11 NOTE — Patient Instructions (Addendum)
Transrectal Ultrasound-Guided Prostate Biopsy, Care After This sheet gives you information about how to care for yourself after your procedure. Your doctor may also give you more specific instructions. If you have problems or questions, contact your doctor. What can I expect after the procedure? After the procedure, it is common to have:  Pain and discomfort in your butt, especially while sitting.  Pink-colored pee (urine), due to small amounts of blood in the pee.  Burning while peeing (urinating).  Blood in your poop (stool).  Bleeding from your butt.  Blood in your semen. Follow these instructions at home: Medicines  Take over-the-counter and prescription medicines only as told by your doctor.  If you were prescribed antibiotic medicine, take it as told by your doctor. Do not stop taking the antibiotic even if you start to feel better. Activity   Do not drive for 24 hours if you were given a medicine to help you relax (sedative) during your procedure.  Return to your normal activities as told by your doctor. Ask your doctor what activities are safe for you.  Ask your doctor when it is okay for you to have sex.  Do not lift anything that is heavier than 10 lb (4.5 kg), or the limit that you are told, until your doctor says that it is safe. General instructions   Drink enough water to keep your pee pale yellow.  Watch your pee, poop, and semen for new bleeding or bleeding that gets worse.  Keep all follow-up visits as told by your doctor. This is important. Contact a doctor if you:  Have blood clots in your pee or poop.  Notice that your pee smells bad or unusual.  Have very bad belly pain.  Have trouble peeing.  Notice that your lower belly feels firm.  Have blood in your pee for more than 2 weeks after the procedure.  Have blood in your semen for more than 2 months after the procedure.  Have problems getting an erection.  Feel sick to your stomach  (nauseous).  Throw up (vomit).  Have new or worse bleeding in your pee, poop, or semen. Get help right away if you:  Have a fever or chills.  Have bright red pee.  Have very bad pain that does not get better with medicine.  Cannot pee. Summary  After this procedure, it is common to have pain and discomfort around your butt, especially while sitting.  You may have blood in your pee and poop.  It is common to have blood in your semen for 1-2 months.  If you were prescribed antibiotic medicine, take it as told by your doctor. Do not stop taking the antibiotic even if you start to feel better.  Get help right away if you have a fever or chills. This information is not intended to replace advice given to you by your health care provider. Make sure you discuss any questions you have with your health care provider. Document Revised: 12/05/2018 Document Reviewed: 06/13/2017 Elsevier Patient Education  2020 [employer].   Hemorrhoids Hemorrhoids are swollen veins in and around the rectum or anus. There are two types of hemorrhoids:  Internal hemorrhoids. These occur in the veins that are just inside the rectum. They may poke through to the outside and become irritated and painful.  External hemorrhoids. These occur in the veins that are outside the anus and can be felt as a painful swelling or hard lump near the anus. Most hemorrhoids do not cause serious  problems, and they can be managed with home treatments such as diet and lifestyle changes. If home treatments do not help the symptoms, procedures can be done to shrink or remove the hemorrhoids. What are the causes? This condition is caused by increased pressure in the anal area. This pressure may result from various things, including:  Constipation.  Straining to have a bowel movement.  Diarrhea.  Pregnancy.  Obesity.  Sitting for long periods of time.  Heavy lifting or other activity that causes you to strain.  Anal  sex.  Riding a bike for a long period of time. What are the signs or symptoms? Symptoms of this condition include:  Pain.  Anal itching or irritation.  Rectal bleeding.  Leakage of stool (feces).  Anal swelling.  One or more lumps around the anus. How is this diagnosed? This condition can often be diagnosed through a visual exam. Other exams or tests may also be done, such as:  An exam that involves feeling the rectal area with a gloved hand (digital rectal exam).  An exam of the anal canal that is done using a small tube (anoscope).  A blood test, if you have lost a significant amount of blood.  A test to look inside the colon using a flexible tube with a camera on the end (sigmoidoscopy or colonoscopy). How is this treated? This condition can usually be treated at home. However, various procedures may be done if dietary changes, lifestyle changes, and other home treatments do not help your symptoms. These procedures can help make the hemorrhoids smaller or remove them completely. Some of these procedures involve surgery, and others do not. Common procedures include:  Rubber band ligation. Rubber bands are placed at the base of the hemorrhoids to cut off their blood supply.  Sclerotherapy. Medicine is injected into the hemorrhoids to shrink them.  Infrared coagulation. A type of light energy is used to get rid of the hemorrhoids.  Hemorrhoidectomy surgery. The hemorrhoids are surgically removed, and the veins that supply them are tied off.  Stapled hemorrhoidopexy surgery. The surgeon staples the base of the hemorrhoid to the rectal wall. Follow these instructions at home: Eating and drinking   Eat foods that have a lot of fiber in them, such as whole grains, beans, nuts, fruits, and vegetables.  Ask your health care provider about taking products that have added fiber (fiber supplements).  Reduce the amount of fat in your diet. You can do this by eating low-fat dairy  products, eating less red meat, and avoiding processed foods.  Drink enough fluid to keep your urine pale yellow. Managing pain and swelling   Take warm sitz baths for 20 minutes, 3-4 times a day to ease pain and discomfort. You may do this in a bathtub or using a portable sitz bath that fits over the toilet.  If directed, apply ice to the affected area. Using ice packs between sitz baths may be helpful. ? Put ice in a plastic bag. ? Place a towel between your skin and the bag. ? Leave the ice on for 20 minutes, 2-3 times a day. General instructions  Take over-the-counter and prescription medicines only as told by your health care provider.  Use medicated creams or suppositories as told.  Get regular exercise. Ask your health care provider how much and what kind of exercise is best for you. In general, you should do moderate exercise for at least 30 minutes on most days of the week (150 minutes each week).  This can include activities such as walking, biking, or yoga.  Go to the bathroom when you have the urge to have a bowel movement. Do not wait.  Avoid straining to have bowel movements.  Keep the anal area dry and clean. Use wet toilet paper or moist towelettes after a bowel movement.  Do not sit on the toilet for long periods of time. This increases blood pooling and pain.  Keep all follow-up visits as told by your health care provider. This is important. Contact a health care provider if you have:  Increasing pain and swelling that are not controlled by treatment or medicine.  Difficulty having a bowel movement, or you are unable to have a bowel movement.  Pain or inflammation outside the area of the hemorrhoids. Get help right away if you have:  Uncontrolled bleeding from your rectum. Summary  Hemorrhoids are swollen veins in and around the rectum or anus.  Most hemorrhoids can be managed with home treatments such as diet and lifestyle changes.  Taking warm sitz  baths can help ease pain and discomfort.  In severe cases, procedures or surgery can be done to shrink or remove the hemorrhoids. This information is not intended to replace advice given to you by your health care provider. Make sure you discuss any questions you have with your health care provider. Document Revised: 01/11/2019 Document Reviewed: 01/04/2018 Elsevier Patient Education  2020 [employer].

## 2020-06-15 LAB — SURGICAL PATHOLOGY

## 2020-06-18 ENCOUNTER — Telehealth: Payer: Self-pay | Admitting: *Deleted

## 2020-06-18 NOTE — Telephone Encounter (Signed)
-----   Message from [name], MD sent at 06/18/2020 10:15 AM EDT ----- Prostate biopsies were benign, no cancer was seen.  Please notify patient.  Recommend follow-up office visit 6 months for DRE with PSA prior. If he is not having post biopsy problems can cancel the appointment tomorrow

## 2020-06-18 NOTE — Telephone Encounter (Signed)
Notified patient as instructed, patient pleased. Discussed follow-up appointments, patient agrees  

## 2020-06-18 NOTE — Progress Notes (Incomplete)
06/19/2020 10:16 AM   Micheal Carr 1956/04/16 [id number]  Referring provider: Jerl Mina, MD [address] [facility] [place],  [place] [address] No chief complaint on file.   HPI: Micheal Carr is a 64 y.o. male who returns for a discussion regarding prostate biopsy results.    Several month history of increasing lower urinary tract symptoms including frequency, urgency, hesitancy and decreased urinary stream. IPSS score was 23/3.   Urinalysis at time of PSA was unremarkable  Patient had noted 2 episodes in the last month of mild burning sensation towards the end of the penis and mild suprapubic burning sensation  Recent PSA is 6.4 as of 05/11/2020  Prostate biopsy performed on 06/11/2020.  Pathology revealed 11/12 cores with benign prostate tissues negative for malignancy. 1/12 cor at the left lateral base with benign well-vascularized fibromuscular and adipose tissue.   Prior history of nephrolithiasis  Component 03/27/20 04/10/19 04/06/15 03/07/14  PSA (Prostate Specific Antigen), Total 9.17High 2.02 2.34 1.93     1. Elevated PSA  2. BPH with LUTS   PMH: Past Medical History:  Diagnosis Date  . Asthma     Surgical History: No past surgical history on file.  Home Medications:  Allergies as of 06/19/2020      Reactions   Levofloxacin Other (See Comments)   Patient states his ENT gave him this med and he had "a little something kind of reaction" and was told not to take it again.   Sulfa Antibiotics Itching      Medication List       Accurate as of June 18, 2020 10:16 AM. If you have any questions, ask your nurse or doctor.        albuterol 108 (90 Base) MCG/ACT inhaler Commonly known as: VENTOLIN HFA Inhale into the lungs.   ascorbic Acid 500 MG Cpcr Commonly known as: VITAMIN C Take 1,000 mg by mouth 2 (two) times daily.   chlorhexidine 0.12 % solution Commonly known as: PERIDEX SMARTSIG:By Mouth   erythromycin  ophthalmic ointment SMARTSIG:1 Sparingly Topical 3 Times Daily   ibuprofen 400 MG tablet Commonly known as: ADVIL Take 400 mg by mouth 3 (three) times daily as needed.   montelukast 10 MG tablet Commonly known as: SINGULAIR TAKE 1 TABLET BY MOUTH EVERY DAY   Multi-Vitamin tablet Take 1 tablet by mouth daily.   omeprazole 40 MG capsule Commonly known as: PRILOSEC Take 40 mg by mouth daily.   pseudoephedrine 120 MG 12 hr tablet Commonly known as: SUDAFED Take 120 mg by mouth at bedtime.   Symbicort 160-4.5 MCG/ACT inhaler Generic drug: budesonide-formoterol Inhale 2 puffs into the lungs in the morning and at bedtime.   tamsulosin 0.4 MG Caps capsule Commonly known as: FLOMAX Take 1 capsule (0.4 mg total) by mouth daily.   Vitamin D3 125 MCG (5000 UT) Caps Take by mouth.   Xhance 93 MCG/ACT Exhu Generic drug: Fluticasone Propionate Place into the nose.       Allergies:  Allergies  Allergen Reactions  . Levofloxacin Other (See Comments)    Patient states his ENT gave him this med and he had "a little something kind of reaction" and was told not to take it again.   . Sulfa Antibiotics Itching    Family History: Family History  Problem Relation Age of Onset  . Alzheimer's disease Mother   . Kidney disease Mother   . Cancer Mother   . Heart disease Mother   . Dementia  Father   . Stroke Father     Social History:  reports that he has never smoked. He has never used smokeless tobacco. No history on file for alcohol use and drug use.   Physical Exam: There were no vitals taken for this visit.  Constitutional:  Alert and oriented, No acute distress. HEENT:  AT, moist mucus membranes.  Trachea midline, no masses. Cardiovascular: No clubbing, cyanosis, or edema. Respiratory: Normal respiratory effort, no increased work of breathing. GI: Abdomen is soft, nontender, nondistended, no abdominal masses GU: No CVA tenderness Lymph: No cervical or inguinal  lymphadenopathy. Skin: No rashes, bruises or suspicious lesions. Neurologic: Grossly intact, no focal deficits, moving all 4 extremities. Psychiatric: Normal mood and affect.  Laboratory Data:  No results found for: CREATININE  No results found for: PSA  No results found for: TESTOSTERONE  No results found for: HGBA1C  Urinalysis   Pertinent Imaging: *** No results found for this or any previous visit.  No results found for this or any previous visit.  No results found for this or any previous visit.  No results found for this or any previous visit.  No results found for this or any previous visit.  No results found for this or any previous visit.  No results found for this or any previous visit.  No results found for this or any previous visit.   Assessment & Plan:     No follow-ups on file.  [facility] [address], [address] [place], [place] [address] [phone]  I, Theador Hawthorne, am acting as a scribe for Dr. Lorin Picket C. Stoioff,  {Add Holiday representative

## 2020-06-19 ENCOUNTER — Ambulatory Visit: Payer: Self-pay | Admitting: Urology

## 2020-08-23 ENCOUNTER — Other Ambulatory Visit: Payer: Self-pay | Admitting: Urology

## 2020-08-24 ENCOUNTER — Telehealth: Payer: Self-pay

## 2020-08-24 ENCOUNTER — Other Ambulatory Visit: Payer: Self-pay | Admitting: *Deleted

## 2020-08-24 DIAGNOSIS — N401 Enlarged prostate with lower urinary tract symptoms: Secondary | ICD-10-CM

## 2020-08-24 MED ORDER — TAMSULOSIN HCL 0.4 MG PO CAPS: 0.4000 mg | ORAL_CAPSULE | Freq: Every day | ORAL | 3 refills | Status: DC

## 2020-08-24 NOTE — Telephone Encounter (Signed)
rx refilled.

## 2020-08-24 NOTE — Telephone Encounter (Signed)
Pt LM on triage line stating that his request for Flomax refill was denied and he would like a call back to discuss why.

## 2020-09-03 ENCOUNTER — Ambulatory Visit: Payer: BC Managed Care – PPO | Admitting: Dermatology

## 2020-10-01 IMAGING — DX DG CHEST 2V
2 series · 2 of 2 positions shown · non-contrast
Comparison: September 02, 2019

CLINICAL DATA: Asthma

EXAM:
CHEST - 2 VIEW

[chest pa]
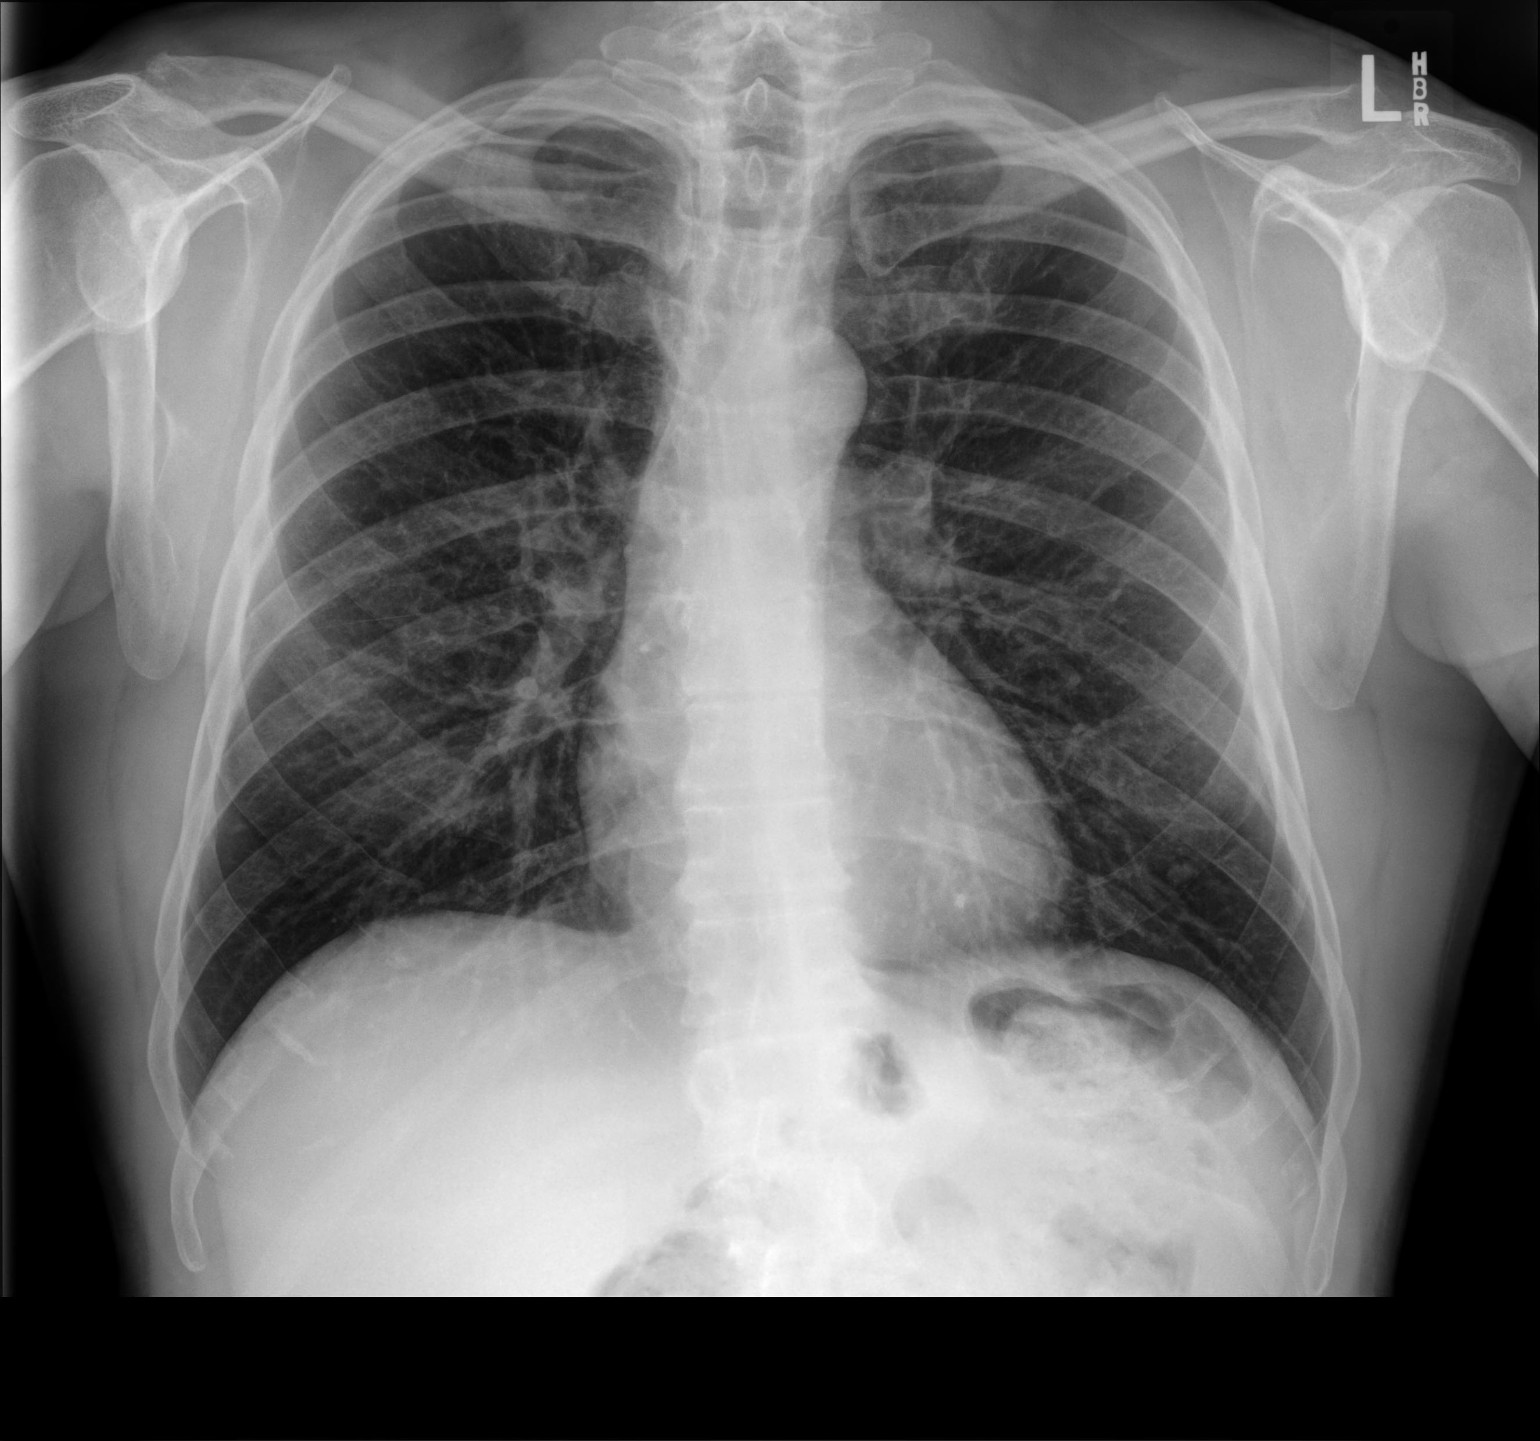

[chest lat]
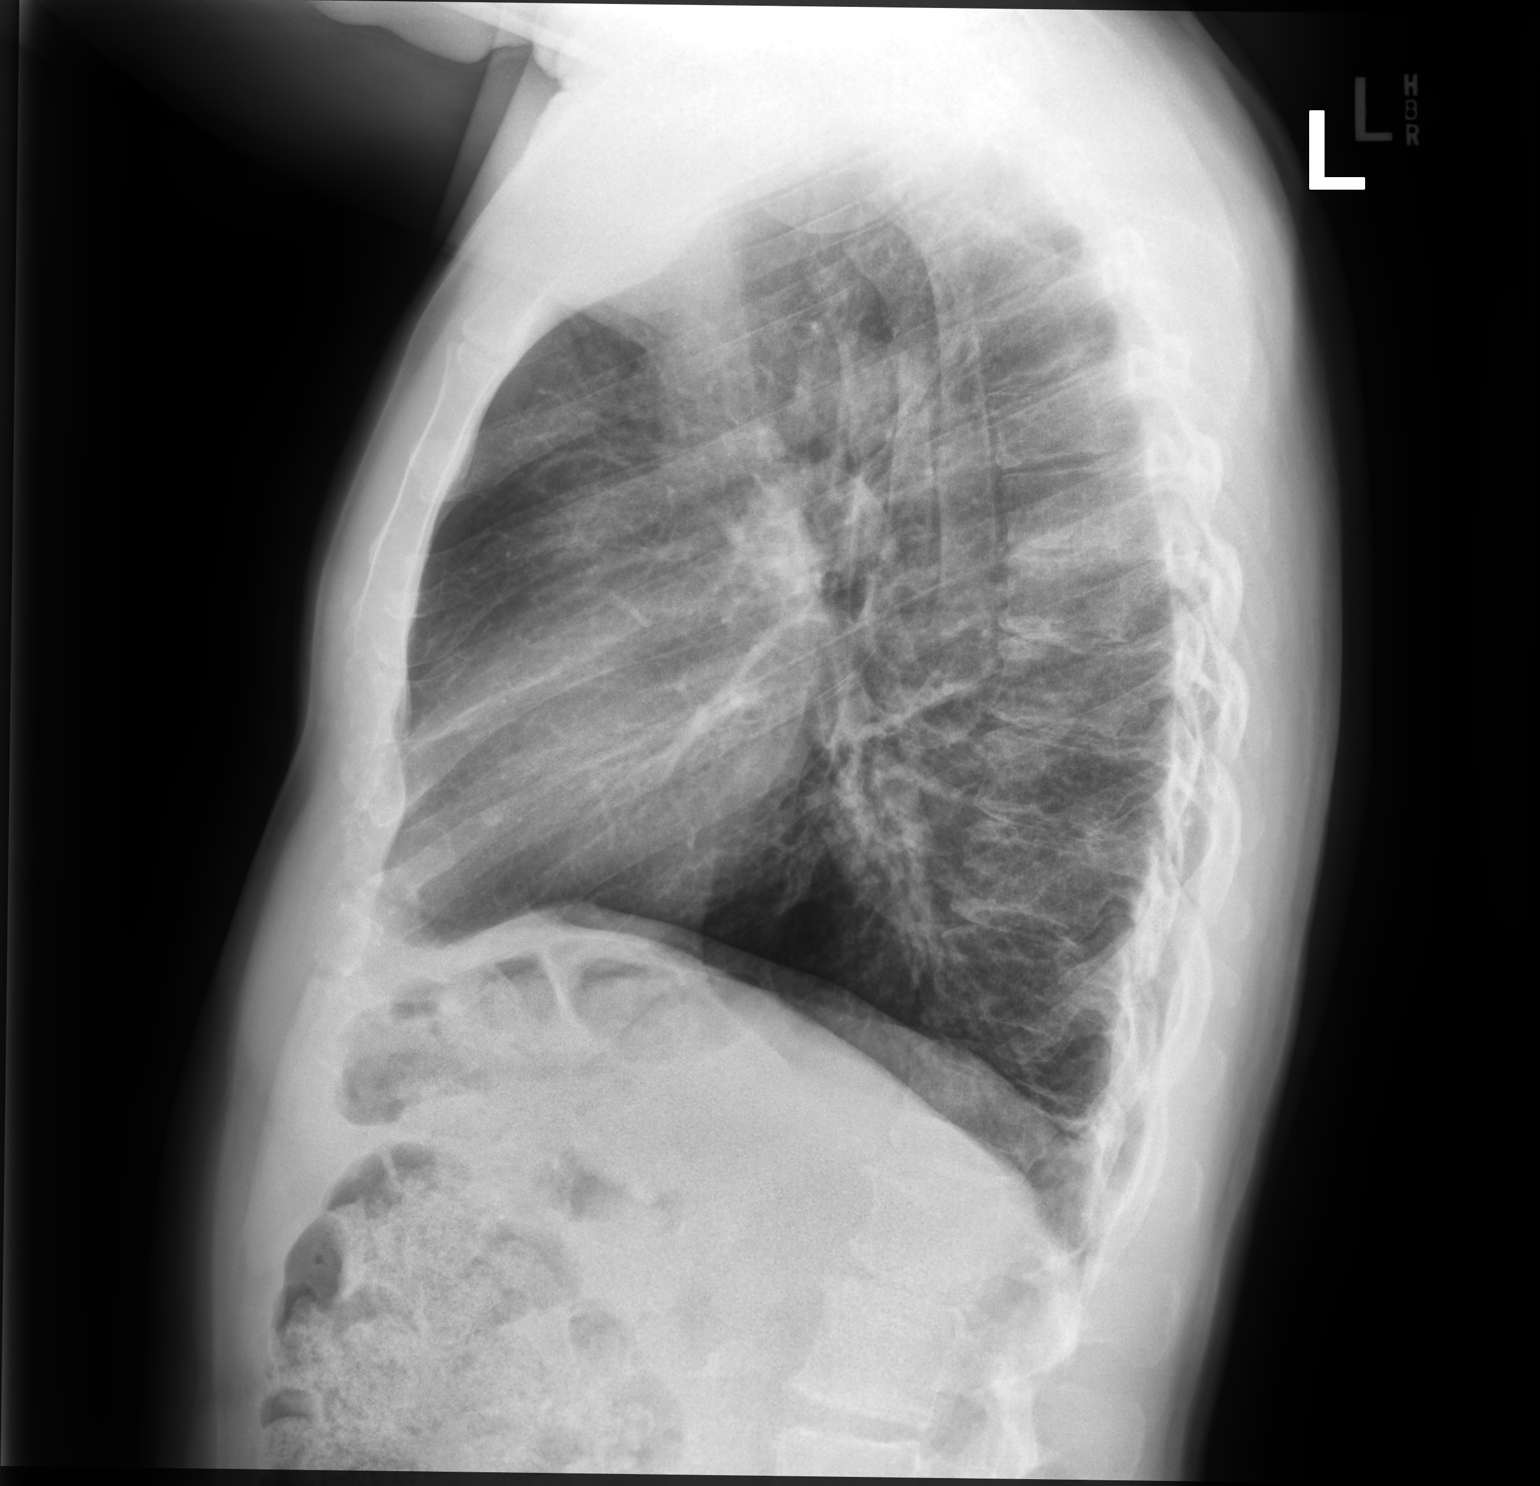

[2 of 2 positions shown; findings below may reference images not displayed]

FINDINGS: Lungs are clear. Heart size and pulmonary vascularity are normal. No
adenopathy. No bone lesions.
IMPRESSION: Lungs clear.  Cardiac silhouette normal.

## 2020-12-15 ENCOUNTER — Other Ambulatory Visit: Payer: Self-pay | Admitting: *Deleted

## 2020-12-15 DIAGNOSIS — R972 Elevated prostate specific antigen [PSA]: Secondary | ICD-10-CM

## 2020-12-17 ENCOUNTER — Other Ambulatory Visit: Payer: BC Managed Care – PPO

## 2020-12-17 ENCOUNTER — Other Ambulatory Visit: Payer: Self-pay

## 2020-12-17 DIAGNOSIS — R972 Elevated prostate specific antigen [PSA]: Secondary | ICD-10-CM

## 2020-12-18 ENCOUNTER — Ambulatory Visit (INDEPENDENT_AMBULATORY_CARE_PROVIDER_SITE_OTHER): Payer: BC Managed Care – PPO | Admitting: Urology

## 2020-12-18 ENCOUNTER — Encounter: Payer: Self-pay | Admitting: Urology

## 2020-12-18 DIAGNOSIS — N401 Enlarged prostate with lower urinary tract symptoms: Secondary | ICD-10-CM | POA: Diagnosis not present

## 2020-12-18 DIAGNOSIS — R972 Elevated prostate specific antigen [PSA]: Secondary | ICD-10-CM

## 2020-12-18 LAB — PSA: Prostate Specific Ag, Serum: 6.4 ng/mL — ABNORMAL HIGH (ref 0.0–4.0)

## 2020-12-18 NOTE — Progress Notes (Signed)
12/18/2020 9:31 AM   Micheal Carr 31-Mar-1956 [id number]  Referring provider: Jerl Mina, MD [address] [facility] [place],  [place] [address]  No chief complaint on file.   Urologic history: 1.  Elevated PSA -Biopsy 04/2020 PSA 9.17; 39 g prostate -Pathology benign prostate tissue  2.  BPH with LUTS -Tamsulosin 0.4 mg daily  HPI: 65 y.o. male presents for 6 month follow-up.   Has noted some worsening urinary hesitancy and decreased stream since his last visit  Does note he voids better when staying hydrated  Denies dysuria, gross hematuria  No flank, abdominal or pelvic pain  PSA 12/17/2020 stable 6.4   PMH: Past Medical History:  Diagnosis Date  . Asthma     Surgical History: No past surgical history on file.  Home Medications:  Allergies as of 12/18/2020      Reactions   Levofloxacin Other (See Comments)   Patient states his ENT gave him this med and he had "a little something kind of reaction" and was told not to take it again.   Sulfa Antibiotics Itching      Medication List       Accurate as of December 18, 2020  9:31 AM. If you have any questions, ask your nurse or doctor.        albuterol 108 (90 Base) MCG/ACT inhaler Commonly known as: VENTOLIN HFA Inhale into the lungs.   ascorbic Acid 500 MG Cpcr Commonly known as: VITAMIN C Take 1,000 mg by mouth 2 (two) times daily.   chlorhexidine 0.12 % solution Commonly known as: PERIDEX SMARTSIG:By Mouth   erythromycin ophthalmic ointment SMARTSIG:1 Sparingly Topical 3 Times Daily   ibuprofen 400 MG tablet Commonly known as: ADVIL Take 400 mg by mouth 3 (three) times daily as needed.   montelukast 10 MG tablet Commonly known as: SINGULAIR TAKE 1 TABLET BY MOUTH EVERY DAY   Multi-Vitamin tablet Take 1 tablet by mouth daily.   omeprazole 40 MG capsule Commonly known as: PRILOSEC Take 40 mg by mouth daily.   pseudoephedrine 120 MG 12 hr tablet Commonly known as:  SUDAFED Take 120 mg by mouth at bedtime.   Symbicort 160-4.5 MCG/ACT inhaler Generic drug: budesonide-formoterol Inhale 2 puffs into the lungs in the morning and at bedtime.   tamsulosin 0.4 MG Caps capsule Commonly known as: FLOMAX Take 1 capsule (0.4 mg total) by mouth daily.   Vitamin D3 125 MCG (5000 UT) Caps Take by mouth.   Xhance 93 MCG/ACT Exhu Generic drug: Fluticasone Propionate Place into the nose.       Allergies:  Allergies  Allergen Reactions  . Levofloxacin Other (See Comments)    Patient states his ENT gave him this med and he had "a little something kind of reaction" and was told not to take it again.   . Sulfa Antibiotics Itching    Family History: Family History  Problem Relation Age of Onset  . Alzheimer's disease Mother   . Kidney disease Mother   . Cancer Mother   . Heart disease Mother   . Dementia Father   . Stroke Father     Social History:  reports that he has never smoked. He has never used smokeless tobacco. No history on file for alcohol use and drug use.   Physical Exam: There were no vitals taken for this visit.  Constitutional:  Alert and oriented, No acute distress. HEENT: [place] AT, moist mucus membranes.  Trachea midline, no masses. Cardiovascular: No clubbing,  cyanosis, or edema. Respiratory: Normal respiratory effort, no increased work of breathing. GU: Prostate 40 g, flat smooth without nodules or induration Psychiatric: Normal mood and affect.   Assessment & Plan:    1.  Elevated PSA  Stable PSA with benign DRE  Lab visit for PSA 6 months  1 year office follow-up for PSA/DRE  2.  BPH with LUTS  Worsening urinary hesitancy  Discussed increasing tamsulosin 0.8 mg or changing medical therapy  He would like to work on increasing fluid intake before any changes and will call back for persistent or worsening symptoms   [name], MD  [facility] [address], [address] [place], [place] [address] [phone]

## 2020-12-21 ENCOUNTER — Ambulatory Visit (INDEPENDENT_AMBULATORY_CARE_PROVIDER_SITE_OTHER): Payer: BC Managed Care – PPO | Admitting: Dermatology

## 2020-12-21 ENCOUNTER — Other Ambulatory Visit: Payer: Self-pay

## 2020-12-21 DIAGNOSIS — L821 Other seborrheic keratosis: Secondary | ICD-10-CM | POA: Diagnosis not present

## 2020-12-21 DIAGNOSIS — L578 Other skin changes due to chronic exposure to nonionizing radiation: Secondary | ICD-10-CM

## 2020-12-21 DIAGNOSIS — L820 Inflamed seborrheic keratosis: Secondary | ICD-10-CM | POA: Diagnosis not present

## 2020-12-21 NOTE — Progress Notes (Signed)
   New Patient Visit  Subjective  Micheal Carr is a 65 y.o. male who presents for the following: Nevus (Moles of left leg that are sometimes bothersome).  The following portions of the chart were reviewed this encounter and updated as appropriate:   Tobacco  Allergies  Meds  Problems  Med Hx  Surg Hx  Fam Hx    Review of Systems:  No other skin or systemic complaints except as noted in HPI or Assessment and Plan.  Objective  Well appearing patient in no apparent distress; mood and affect are within normal limits.  A focused examination was performed including legs. Relevant physical exam findings are noted in the Assessment and Plan.  Objective  Left post thigh x 1, left lat knee x 1, left lat thigh x 1 (3): Erythematous keratotic or waxy stuck-on papule or plaque.    Assessment & Plan    Seborrheic Keratoses - Stuck-on, waxy, tan-brown papules and/or plaques  - Benign-appearing - Discussed benign etiology and prognosis. - Observe - Call for any changes  Inflamed seborrheic keratosis (3) Left post thigh x 1, left lat knee x 1, left lat thigh x 1  Destruction of lesion - Left post thigh x 1, left lat knee x 1, left lat thigh x 1 Complexity: simple   Destruction method: cryotherapy   Informed consent: discussed and consent obtained   Timeout:  patient name, date of birth, surgical site, and procedure verified Lesion destroyed using liquid nitrogen: Yes   Region frozen until ice ball extended beyond lesion: Yes   Outcome: patient tolerated procedure well with no complications   Post-procedure details: wound care instructions given    Actinic Damage - chronic, secondary to cumulative UV radiation exposure/sun exposure over time - diffuse scaly erythematous macules with underlying dyspigmentation - Recommend daily broad spectrum sunscreen SPF 30+ to sun-exposed areas, reapply every 2 hours as needed.  - Recommend staying in the shade or wearing long sleeves, sun  glasses (UVA+UVB protection) and wide brim hats (4-inch brim around the entire circumference of the hat). - Call for new or changing lesions.  Return if symptoms worsen or fail to improve.  I, Joanie Coddington, CMA, am acting as scribe for Armida Sans, MD .  Documentation: I have reviewed the above documentation for accuracy and completeness, and I agree with the above.  Armida Sans, MD

## 2020-12-21 NOTE — Patient Instructions (Signed)

## 2020-12-22 ENCOUNTER — Encounter: Payer: Self-pay | Admitting: Dermatology

## 2021-03-04 ENCOUNTER — Encounter: Payer: Self-pay | Admitting: *Deleted

## 2021-03-05 ENCOUNTER — Encounter
Admission: RE | Disposition: A | Discharge status: Home / Self Care | Payer: Self-pay | Source: Home / Self Care | Attending: Gastroenterology

## 2021-03-05 ENCOUNTER — Ambulatory Visit: Payer: BC Managed Care – PPO | Admitting: Anesthesiology

## 2021-03-05 ENCOUNTER — Other Ambulatory Visit: Payer: Self-pay

## 2021-03-05 ENCOUNTER — Ambulatory Visit
Admission: RE | Admit: 2021-03-05 | Discharge: 2021-03-05 | Disposition: A | Discharge status: Home / Self Care | Payer: BC Managed Care – PPO | Attending: Gastroenterology | Admitting: Gastroenterology

## 2021-03-05 ENCOUNTER — Encounter: Payer: Self-pay | Admitting: *Deleted

## 2021-03-05 DIAGNOSIS — Z87442 Personal history of urinary calculi: Secondary | ICD-10-CM | POA: Insufficient documentation

## 2021-03-05 DIAGNOSIS — Z79899 Other long term (current) drug therapy: Secondary | ICD-10-CM | POA: Insufficient documentation

## 2021-03-05 DIAGNOSIS — K5909 Other constipation: Secondary | ICD-10-CM | POA: Insufficient documentation

## 2021-03-05 DIAGNOSIS — J45909 Unspecified asthma, uncomplicated: Secondary | ICD-10-CM | POA: Insufficient documentation

## 2021-03-05 DIAGNOSIS — Z881 Allergy status to other antibiotic agents status: Secondary | ICD-10-CM | POA: Diagnosis not present

## 2021-03-05 DIAGNOSIS — K5730 Diverticulosis of large intestine without perforation or abscess without bleeding: Secondary | ICD-10-CM | POA: Diagnosis not present

## 2021-03-05 DIAGNOSIS — Z7951 Long term (current) use of inhaled steroids: Secondary | ICD-10-CM | POA: Diagnosis not present

## 2021-03-05 DIAGNOSIS — Z1211 Encounter for screening for malignant neoplasm of colon: Secondary | ICD-10-CM | POA: Diagnosis present

## 2021-03-05 DIAGNOSIS — Z791 Long term (current) use of non-steroidal anti-inflammatories (NSAID): Secondary | ICD-10-CM | POA: Diagnosis not present

## 2021-03-05 DIAGNOSIS — Z882 Allergy status to sulfonamides status: Secondary | ICD-10-CM | POA: Diagnosis not present

## 2021-03-05 DIAGNOSIS — K641 Second degree hemorrhoids: Secondary | ICD-10-CM | POA: Insufficient documentation

## 2021-03-05 HISTORY — PX: COLONOSCOPY WITH PROPOFOL: SHX5780

## 2021-03-05 HISTORY — DX: Chronic sinusitis, unspecified: J32.9

## 2021-03-05 HISTORY — DX: Cardiac murmur, unspecified: R01.1

## 2021-03-05 HISTORY — DX: Personal history of urinary calculi: Z87.442

## 2021-03-05 SURGERY — COLONOSCOPY WITH PROPOFOL
Anesthesia: General

## 2021-03-05 MED ORDER — LIDOCAINE HCL (CARDIAC) PF 100 MG/5ML IV SOSY
PREFILLED_SYRINGE | INTRAVENOUS | Status: DC | PRN
  Administered 2021-03-05: 50 mg via INTRAVENOUS

## 2021-03-05 MED ORDER — PROPOFOL 500 MG/50ML IV EMUL
INTRAVENOUS | Status: DC | PRN
  Administered 2021-03-05: 175 ug/kg/min via INTRAVENOUS

## 2021-03-05 MED ORDER — PROPOFOL 10 MG/ML IV BOLUS
INTRAVENOUS | Status: DC | PRN
  Administered 2021-03-05: 80 mg via INTRAVENOUS

## 2021-03-05 MED ORDER — SODIUM CHLORIDE 0.9 % IV SOLN: INTRAVENOUS | Status: DC

## 2021-03-05 NOTE — Anesthesia Preprocedure Evaluation (Signed)
Anesthesia Evaluation  Patient identified by MRN, date of birth, ID band Patient awake    Reviewed: Allergy & Precautions, NPO status , Patient's Chart, lab work & pertinent test results  History of Anesthesia Complications Negative for: history of anesthetic complications  Airway Mallampati: III  TM Distance: <3 FB Neck ROM: full    Dental  (+) Chipped   Pulmonary asthma ,    Pulmonary exam normal        Cardiovascular Exercise Tolerance: Good (-) anginaNormal cardiovascular exam     Neuro/Psych negative neurological ROS  negative psych ROS   GI/Hepatic negative GI ROS, Neg liver ROS,   Endo/Other  negative endocrine ROS  Renal/GU Renal disease  negative genitourinary   Musculoskeletal  (+) Arthritis ,   Abdominal   Peds  Hematology negative hematology ROS (+)   Anesthesia Other Findings Past Medical History: No date: Asthma No date: Chronic sinus infection No date: Heart murmur No date: History of kidney stones  Past Surgical History: No date: COLONOSCOPY No date: FUNCTIONAL ENDOSCOPIC SINUS SURGERY No date: NASAL SEPTUM SURGERY No date: TONSILLECTOMY No date: trigger thumb; Right     Reproductive/Obstetrics negative OB ROS                             Anesthesia Physical Anesthesia Plan  ASA: 3  Anesthesia Plan: General   Post-op Pain Management:    Induction: Intravenous  PONV Risk Score and Plan: Propofol infusion and TIVA  Airway Management Planned: Natural Airway and Nasal Cannula  Additional Equipment:   Intra-op Plan:   Post-operative Plan:   Informed Consent: I have reviewed the patients History and Physical, chart, labs and discussed the procedure including the risks, benefits and alternatives for the proposed anesthesia with the patient or authorized representative who has indicated his/her understanding and acceptance.     Dental Advisory  Given  Plan Discussed with: Anesthesiologist, CRNA and Surgeon  Anesthesia Plan Comments: (Patient consented for risks of anesthesia including but not limited to:  - adverse reactions to medications - risk of airway placement if required - damage to eyes, teeth, lips or other oral mucosa - nerve damage due to positioning  - sore throat or hoarseness - Damage to heart, brain, nerves, lungs, other parts of body or loss of life  Patient voiced understanding.)        Anesthesia Quick Evaluation

## 2021-03-05 NOTE — Interval H&P Note (Signed)
History and Physical Interval Note:  03/05/2021 12:36 PM  Micheal Carr  has presented today for surgery, with the diagnosis of SCREENING.  The various methods of treatment have been discussed with the patient and family. After consideration of risks, benefits and other options for treatment, the patient has consented to  Procedure(s): COLONOSCOPY WITH PROPOFOL (N/A) as a surgical intervention.  The patient's history has been reviewed, patient examined, no change in status, stable for surgery.  I have reviewed the patient's chart and labs.  Questions were answered to the patient's satisfaction.     Regis Bill  Ok to proceed with colonoscopy

## 2021-03-05 NOTE — Transfer of Care (Signed)
Immediate Anesthesia Transfer of Care Note  Patient: Micheal Carr  Procedure(s) Performed: COLONOSCOPY WITH PROPOFOL  Patient Location: PACU  Anesthesia Type:General  Level of Consciousness: sedated  Airway & Oxygen Therapy: Patient Spontanous Breathing  Post-op Assessment: Report given to RN and Post -op Vital signs reviewed and stable  Post vital signs: Reviewed and stable  Last Vitals:  Vitals Value Taken Time  BP 108/66 03/05/21 1303  Temp 36.1 C 03/05/21 1303  Pulse 82 03/05/21 1308  Resp 15 03/05/21 1308  SpO2 98 % 03/05/21 1308  Vitals shown include unvalidated device data.  Last Pain:  Vitals:   03/05/21 1303  TempSrc: Temporal  PainSc: Asleep         Complications: No notable events documented.

## 2021-03-05 NOTE — Anesthesia Postprocedure Evaluation (Signed)
Anesthesia Post Note  Patient: Micheal Carr  Procedure(s) Performed: COLONOSCOPY WITH PROPOFOL  Patient location during evaluation: Endoscopy Anesthesia Type: General Level of consciousness: awake and alert Pain management: pain level controlled Vital Signs Assessment: post-procedure vital signs reviewed and stable Respiratory status: spontaneous breathing, nonlabored ventilation, respiratory function stable and patient connected to nasal cannula oxygen Cardiovascular status: blood pressure returned to baseline and stable Postop Assessment: no apparent nausea or vomiting Anesthetic complications: no   No notable events documented.   Last Vitals:  Vitals:   03/05/21 1323 03/05/21 1333  BP: 129/74 (!) 136/91  Pulse: 71 72  Resp: 17 15  Temp:    SpO2: 99% 100%    Last Pain:  Vitals:   03/05/21 1333  TempSrc:   PainSc: 0-No pain                 Cleda Mccreedy Abdulai Blaylock

## 2021-03-05 NOTE — H&P (Signed)
Outpatient short stay form Pre-procedure 03/05/2021 12:34 PM Merlyn Lot MD, MPH  Primary Physician: Dr. Burnett Sheng  Reason for visit:  Screening Colonoscopy  History of present illness:   65 y/o gentleman with chronic constipation managed with prune juice here for screening colonoscopy. Had normal colonoscopy in 2007. No blood thinners, no abdominal surgeries. No family history of GI malignancies.    Current Facility-Administered Medications:    0.9 %  sodium chloride infusion, , Intravenous, Continuous, Gay Rape, Rossie Muskrat, MD  Medications Prior to Admission  Medication Sig Dispense Refill Last Dose   albuterol (VENTOLIN HFA) 108 (90 Base) MCG/ACT inhaler Inhale into the lungs.   Past Week   ascorbic Acid (VITAMIN C) 500 MG CPCR Take 1,000 mg by mouth 2 (two) times daily.    03/04/2021   budesonide-formoterol (SYMBICORT) 160-4.5 MCG/ACT inhaler Inhale 2 puffs into the lungs in the morning and at bedtime.    Past Week   cetirizine (ZYRTEC) 10 MG tablet Take 10 mg by mouth daily.   03/04/2021   Cholecalciferol (VITAMIN D3) 125 MCG (5000 UT) CAPS Take by mouth.   03/04/2021   Fluticasone Propionate (XHANCE) 93 MCG/ACT EXHU Place into the nose.   Past Week   gabapentin (NEURONTIN) 300 MG capsule Take 300 mg by mouth 3 (three) times daily.      montelukast (SINGULAIR) 10 MG tablet TAKE 1 TABLET BY MOUTH EVERY DAY 90 tablet 2 03/04/2021   Multiple Vitamin (MULTI-VITAMIN) tablet Take 1 tablet by mouth daily.    03/04/2021   omeprazole (PRILOSEC) 40 MG capsule Take 40 mg by mouth daily.    03/04/2021   pseudoephedrine (SUDAFED) 120 MG 12 hr tablet Take 120 mg by mouth at bedtime.    03/04/2021   tamsulosin (FLOMAX) 0.4 MG CAPS capsule Take 1 capsule (0.4 mg total) by mouth daily. 90 capsule 3 03/04/2021   chlorhexidine (PERIDEX) 0.12 % solution SMARTSIG:By Mouth      erythromycin ophthalmic ointment SMARTSIG:1 Sparingly Topical 3 Times Daily      ibuprofen (ADVIL) 400 MG tablet Take 400 mg by mouth 3  (three) times daily as needed.        Allergies  Allergen Reactions   Levofloxacin Other (See Comments)    Patient states his ENT gave him this med and he had "a little something kind of reaction" and was told not to take it again.    Sulfa Antibiotics Itching     Past Medical History:  Diagnosis Date   Asthma    Chronic sinus infection    Heart murmur    History of kidney stones     Review of systems:  Otherwise negative.    Physical Exam  Gen: Alert, oriented. Appears stated age.  HEENT: [place]. Lungs: No respiratory distress CV: RRR Abd: soft, benign, no masses Ext: No edema    Planned procedures: Proceed with colonoscopy. The patient understands the nature of the planned procedure, indications, risks, alternatives and potential complications including but not limited to bleeding, infection, perforation, damage to internal organs and possible oversedation/side effects from anesthesia. The patient agrees and gives consent to proceed.  Please refer to procedure notes for findings, recommendations and patient disposition/instructions.     Merlyn Lot MD, MPH Gastroenterology 03/05/2021  12:34 PM

## 2021-03-05 NOTE — Op Note (Signed)
Outpatient [facility] Gastroenterology Patient Name: Micheal Carr Procedure Date: 03/05/2021 11:36 AM MRN: [id number] Account #: [id number] Date of Birth: May 10, 1956 Admit Type: Outpatient Age: 65 Room: [facility] ENDO ROOM 3 Gender: Male Note Status: Finalized Procedure:             Colonoscopy Indications:           Screening for colorectal malignant neoplasm Providers:             Andrey Farmer MD, MD Medicines:             Monitored Anesthesia Care Complications:         No immediate complications. Procedure:             Pre-Anesthesia Assessment:                        - Prior to the procedure, a History and Physical was                         performed, and patient medications and allergies were                         reviewed. The patient is competent. The risks and                         benefits of the procedure and the sedation options and                         risks were discussed with the patient. All questions                         were answered and informed consent was obtained.                         Patient identification and proposed procedure were                         verified by the physician, the nurse, the anesthetist                         and the technician in the endoscopy suite. Mental                         Status Examination: alert and oriented. Airway                         Examination: normal oropharyngeal airway and neck                         mobility. Respiratory Examination: clear to                         auscultation. CV Examination: normal. Prophylactic                         Antibiotics: The patient does not require prophylactic                         antibiotics. Prior Anticoagulants: The patient has  taken no previous anticoagulant or antiplatelet                         agents. ASA Grade Assessment: II - A patient with mild                         systemic disease. After reviewing the risks and                          benefits, the patient was deemed in satisfactory                         condition to undergo the procedure. The anesthesia                         plan was to use monitored anesthesia care (MAC).                         Immediately prior to administration of medications,                         the patient was re-assessed for adequacy to receive                         sedatives. The heart rate, respiratory rate, oxygen                         saturations, blood pressure, adequacy of pulmonary                         ventilation, and response to care were monitored                         throughout the procedure. The physical status of the                         patient was re-assessed after the procedure.                        After obtaining informed consent, the colonoscope was                         passed under direct vision. Throughout the procedure,                         the patient's blood pressure, pulse, and oxygen                         saturations were monitored continuously. The                         Colonoscope was introduced through the anus and                         advanced to the the cecum, identified by appendiceal                         orifice and ileocecal valve. The colonoscopy was  performed without difficulty. The patient tolerated                         the procedure well. The quality of the bowel                         preparation was adequate to identify polyps. Findings:      The perianal and digital rectal examinations were normal.      The lumen of the descending colon, splenic flexure, transverse colon,       hepatic flexure, ascending colon and cecum was moderately dilated.      Many small-mouthed diverticula were found in the sigmoid colon.      Internal hemorrhoids were found during retroflexion. The hemorrhoids       were Grade II (internal hemorrhoids that prolapse but reduce        spontaneously).      The exam was otherwise without abnormality on direct and retroflexion       views. Impression:            - Dilated in the descending colon, at the splenic                         flexure, in the transverse colon, at the hepatic                         flexure, in the ascending colon and in the cecum.                        - Diverticulosis in the sigmoid colon.                        - Internal hemorrhoids.                        - The examination was otherwise normal on direct and                         retroflexion views.                        - No specimens collected. Recommendation:        - Discharge patient to home.                        - Resume previous diet.                        - Continue present medications.                        - Repeat colonoscopy in 10 years for screening                         purposes.                        - Return to GI clinic as previously scheduled.                        - Can discuss anorectal manometry for evaluation of  dilated colon versus empiric trial of linzess Procedure Code(s):     --- Professional ---                        J2426, Colorectal cancer screening; colonoscopy on                         individual not meeting criteria for high risk Diagnosis Code(s):     --- Professional ---                        Z12.11, Encounter for screening for malignant neoplasm                         of colon                        K59.39, Other megacolon                        K64.1, Second degree hemorrhoids                        K57.30, Diverticulosis of large intestine without                         perforation or abscess without bleeding CPT copyright 2019 [employer]. All rights reserved. The codes documented in this report are preliminary and upon coder review may  be revised to meet current compliance requirements. Andrey Farmer MD, MD 03/05/2021 1:04:31 PM Number of  Addenda: 0 Note Initiated On: 03/05/2021 11:36 AM Scope Withdrawal Time: 0 hours 9 minutes 44 seconds  Total Procedure Duration: 0 hours 14 minutes 51 seconds  Estimated Blood Loss:  Estimated blood loss: none.      [facility]

## 2021-03-05 NOTE — Anesthesia Procedure Notes (Signed)
Date/Time: 03/05/2021 12:40 PM Performed by: Ginger Carne, CRNA Pre-anesthesia Checklist: Patient identified, Emergency Drugs available, Suction available, Patient being monitored and Timeout performed Patient Re-evaluated:Patient Re-evaluated prior to induction Oxygen Delivery Method: Nasal cannula Preoxygenation: Pre-oxygenation with 100% oxygen Induction Type: IV induction

## 2021-03-08 ENCOUNTER — Encounter: Payer: Self-pay | Admitting: Gastroenterology

## 2021-03-11 ENCOUNTER — Encounter: Payer: Self-pay | Admitting: Gastroenterology

## 2021-03-26 ENCOUNTER — Other Ambulatory Visit: Payer: Self-pay

## 2021-03-26 ENCOUNTER — Emergency Department
Admission: EM | Admit: 2021-03-26 | Discharge: 2021-03-27 | Disposition: A | Discharge status: Home / Self Care | Payer: BC Managed Care – PPO | Attending: Emergency Medicine | Admitting: Emergency Medicine

## 2021-03-26 ENCOUNTER — Emergency Department: Payer: BC Managed Care – PPO

## 2021-03-26 DIAGNOSIS — Z7951 Long term (current) use of inhaled steroids: Secondary | ICD-10-CM | POA: Diagnosis not present

## 2021-03-26 DIAGNOSIS — J45901 Unspecified asthma with (acute) exacerbation: Secondary | ICD-10-CM

## 2021-03-26 DIAGNOSIS — J4541 Moderate persistent asthma with (acute) exacerbation: Secondary | ICD-10-CM | POA: Diagnosis not present

## 2021-03-26 DIAGNOSIS — Z20822 Contact with and (suspected) exposure to covid-19: Secondary | ICD-10-CM | POA: Insufficient documentation

## 2021-03-26 DIAGNOSIS — R0602 Shortness of breath: Secondary | ICD-10-CM | POA: Diagnosis present

## 2021-03-26 NOTE — ED Triage Notes (Addendum)
Pt with shob since 1900 today. Pt with history ashtma, pt states has been taking albuterol without relief. Pt is able to speak in full sentences, but is moving minimal air on auscultation. Pt states he does feel like his symptoms are improving.

## 2021-03-27 LAB — COMPREHENSIVE METABOLIC PANEL
ALT: 18 U/L (ref 0–44)
AST: 22 U/L (ref 15–41)
Albumin: 3.8 g/dL (ref 3.5–5.0)
Alkaline Phosphatase: 68 U/L (ref 38–126)
Anion gap: 5 (ref 5–15)
BUN: 14 mg/dL (ref 8–23)
CO2: 24 mmol/L (ref 22–32)
Calcium: 8.9 mg/dL (ref 8.9–10.3)
Chloride: 109 mmol/L (ref 98–111)
Creatinine, Ser: 1.25 mg/dL — ABNORMAL HIGH (ref 0.61–1.24)
GFR, Estimated: >60 mL/min (ref >60)
Glucose, Bld: 104 mg/dL — ABNORMAL HIGH (ref 70–99)
Potassium: 3.3 mmol/L — ABNORMAL LOW (ref 3.5–5.1)
Sodium: 138 mmol/L (ref 135–145)
Total Bilirubin: 0.6 mg/dL (ref 0.3–1.2)
Total Protein: 6.2 g/dL — ABNORMAL LOW (ref 6.5–8.1)

## 2021-03-27 LAB — CBC
HCT: 40.2 % (ref 39.0–52.0)
Hemoglobin: 14.2 g/dL (ref 13.0–17.0)
MCH: 29.8 pg (ref 26.0–34.0)
MCHC: 35.3 g/dL (ref 30.0–36.0)
MCV: 84.5 fL (ref 80.0–100.0)
Platelets: 252 10*3/uL (ref 150–400)
RBC: 4.76 MIL/uL (ref 4.22–5.81)
RDW: 12.2 % (ref 11.5–15.5)
WBC: 10.6 10*3/uL — ABNORMAL HIGH (ref 4.0–10.5)
nRBC: 0 % (ref 0.0–0.2)

## 2021-03-27 LAB — RESP PANEL BY RT-PCR (FLU A&B, COVID) ARPGX2
Influenza A by PCR: NEGATIVE
Influenza B by PCR: NEGATIVE
SARS Coronavirus 2 by RT PCR: NEGATIVE

## 2021-03-27 LAB — TROPONIN I (HIGH SENSITIVITY): Troponin I (High Sensitivity): 4 ng/L (ref <18)

## 2021-03-27 LAB — BRAIN NATRIURETIC PEPTIDE: B Natriuretic Peptide: 8.4 pg/mL (ref 0.0–100.0)

## 2021-03-27 MED ORDER — PREDNISONE 20 MG PO TABS: 60.0000 mg | ORAL_TABLET | Freq: Every day | ORAL | 0 refills | Status: AC

## 2021-03-27 MED ORDER — IPRATROPIUM-ALBUTEROL 0.5-2.5 (3) MG/3ML IN SOLN
3.0000 mL | Freq: Once | RESPIRATORY_TRACT | Status: AC
  Administered 2021-03-27: 3 mL via RESPIRATORY_TRACT
  Filled 2021-03-27: qty 3

## 2021-03-27 MED ORDER — METHYLPREDNISOLONE SODIUM SUCC 125 MG IJ SOLR
125.0000 mg | Freq: Once | INTRAMUSCULAR | Status: AC
  Administered 2021-03-27: 125 mg via INTRAVENOUS
  Filled 2021-03-27: qty 2

## 2021-03-27 NOTE — ED Notes (Signed)
Patient verbalizes understanding of discharge instructions. Opportunity for questioning and answers were provided. Armband removed by staff, pt discharged from ED. Ambulated out to lobby  

## 2021-03-27 NOTE — ED Provider Notes (Signed)
[facility] Emergency Department Provider Note  ____________________________________________   Event Date/Time   First MD Initiated Contact with Patient 03/26/21 2347     (approximate)  I have reviewed the triage vital signs and the nursing notes.   HISTORY  Chief Complaint Shortness of Breath    HPI Micheal Carr is a 65 y.o. male with medical history as listed below which notably includes asthma that is generally well controlled.  He has never been hospitalized or intubated for his asthma.  Tonight he had a cute onset of shortness of breath that started about 6 PM.  Nothing in particular made it better or worse.  He has a number of seasonal and environmental allergies but he cannot think of anything with which she came into contact.  He tried using his inhalers and the symptoms got a little bit better but then gradually got worse and became severe before coming to the ED.  He felt a little bit better once he got here but still feels short of breath and wheezy.  He is not having chest pain.  He denies fever, sore throat, nausea, vomiting, abdominal pain.  Symptoms are worse with exertion, slightly better with inhaler.     Past Medical History:  Diagnosis Date   Asthma    Chronic sinus infection    Heart murmur    History of kidney stones     Patient Active Problem List   Diagnosis Date Noted   Allergy 06/11/2020   Hemorrhoids 06/11/2020   Kidney stones 06/11/2020   Seasonal allergies 06/11/2020   Asthma without status asthmaticus 12/24/2019   Chronic sinusitis 12/24/2019   Paresthesia and pain of both upper extremities 05/23/2019   Bilateral hand numbness 05/09/2019   DDD (degenerative disc disease), lumbar 06/22/2018    Past Surgical History:  Procedure Laterality Date   COLONOSCOPY     COLONOSCOPY WITH PROPOFOL N/A 03/05/2021   Procedure: COLONOSCOPY WITH PROPOFOL;  Surgeon: Regis Bill, MD;  Location: [facility] ENDOSCOPY;   Service: Endoscopy;  Laterality: N/A;   FUNCTIONAL ENDOSCOPIC SINUS SURGERY     NASAL SEPTUM SURGERY     TONSILLECTOMY     trigger thumb Right     Prior to Admission medications   Medication Sig Start Date End Date Taking? Authorizing Provider  predniSONE (DELTASONE) 20 MG tablet Take 3 tablets (60 mg total) by mouth daily with breakfast for 5 days. 03/27/21 04/01/21 Yes Loleta Rose, MD  albuterol (VENTOLIN HFA) 108 (90 Base) MCG/ACT inhaler Inhale into the lungs. 06/06/18   [provider]  ascorbic Acid (VITAMIN C) 500 MG CPCR Take 1,000 mg by mouth 2 (two) times daily.     [provider]  budesonide-formoterol (SYMBICORT) 160-4.5 MCG/ACT inhaler Inhale 2 puffs into the lungs in the morning and at bedtime.     [provider]  cetirizine (ZYRTEC) 10 MG tablet Take 10 mg by mouth daily.    [provider]  chlorhexidine (PERIDEX) 0.12 % solution SMARTSIG:By Mouth 05/07/20   [provider]  Cholecalciferol (VITAMIN D3) 125 MCG (5000 UT) CAPS Take by mouth.    [provider]  erythromycin ophthalmic ointment SMARTSIG:1 Sparingly Topical 3 Times Daily 04/13/20   [provider]  Fluticasone Propionate (XHANCE) 93 MCG/ACT EXHU Place into the nose.    [provider]  gabapentin (NEURONTIN) 300 MG capsule Take 300 mg by mouth 3 (three) times daily.    [provider]  ibuprofen (ADVIL) 400 MG tablet  Take 400 mg by mouth 3 (three) times daily as needed. 05/07/20   [provider]  montelukast (SINGULAIR) 10 MG tablet TAKE 1 TABLET BY MOUTH EVERY DAY 02/25/20   Erin Fulling, MD  Multiple Vitamin (MULTI-VITAMIN) tablet Take 1 tablet by mouth daily.     [provider]  omeprazole (PRILOSEC) 40 MG capsule Take 40 mg by mouth daily.  04/13/14   [provider]  pseudoephedrine (SUDAFED) 120 MG 12 hr tablet Take 120 mg by mouth at bedtime.     [provider]  tamsulosin (FLOMAX) 0.4 MG CAPS  capsule Take 1 capsule (0.4 mg total) by mouth daily. 08/24/20   Stoioff, Verna Czech, MD    Allergies Levofloxacin and Sulfa antibiotics  Family History  Problem Relation Age of Onset   Alzheimer's disease Mother    Kidney disease Mother    Cancer Mother    Heart disease Mother    Dementia Father    Stroke Father     Social History Social History   Tobacco Use   Smoking status: Never   Smokeless tobacco: Never  Vaping Use   Vaping Use: Never used  Substance Use Topics   Alcohol use: Never   Drug use: Never    Review of Systems Constitutional: No fever/chills Eyes: No visual changes. ENT: No sore throat. Cardiovascular: Denies chest pain. Respiratory: Positive for dyspnea. Gastrointestinal: No abdominal pain.  No nausea, no vomiting.  No diarrhea.  No constipation. Genitourinary: Negative for dysuria. Musculoskeletal: Negative for neck pain.  Negative for back pain. Integumentary: Negative for rash. Neurological: Negative for headaches, focal weakness or numbness.   ____________________________________________   PHYSICAL EXAM:  VITAL SIGNS: ED Triage Vitals  Enc Vitals Group     BP 03/26/21 2333 135/80     Pulse Rate 03/26/21 2333 88     Resp 03/26/21 2333 (!) 22     Temp --      Temp src --      SpO2 03/26/21 2333 100 %     Weight 03/26/21 2334 64 kg (141 lb 1.5 oz)     Height 03/26/21 2334 1.651 m (5\' 5" )     Head Circumference --      Peak Flow --      Pain Score --      Pain Loc --      Pain Edu? --      Excl. in GC? --     Constitutional: Alert and oriented.  Eyes: Conjunctivae are normal.  Head: Atraumatic. Nose: No congestion/rhinnorhea. Mouth/Throat: Patient is wearing a mask. Neck: No stridor.  No meningeal signs.   Cardiovascular: Normal rate, regular rhythm. Good peripheral circulation. Respiratory: Mildly increased respiratory rate.  No significant increase in respiratory effort without accessory muscle usage but with expiratory  wheezing heard throughout lung fields. Gastrointestinal: Soft and nontender. No distention.  Musculoskeletal: No lower extremity tenderness nor edema. No gross deformities of extremities. Neurologic:  Normal speech and language. No gross focal neurologic deficits are appreciated.  Skin:  Skin is warm, dry and intact. Psychiatric: Mood and affect are normal. Speech and behavior are normal.  ____________________________________________   LABS (all labs ordered are listed, but only abnormal results are displayed)  Labs Reviewed  CBC - Abnormal; Notable for the following components:      Result Value   WBC 10.6 (*)    All other components within normal limits  COMPREHENSIVE METABOLIC PANEL - Abnormal; Notable for the following components:   Potassium  3.3 (*)    Glucose, Bld 104 (*)    Creatinine, Ser 1.25 (*)    Total Protein 6.2 (*)    All other components within normal limits  RESP PANEL BY RT-PCR (FLU A&B, COVID) ARPGX2  BRAIN NATRIURETIC PEPTIDE  TROPONIN I (HIGH SENSITIVITY)   ____________________________________________  EKG  ED ECG REPORT I, Loleta Roseory Orion Vandervort, the attending physician, personally viewed and interpreted this ECG.  Date: 03/26/2021 EKG Time: 23: 43 Rate: 87 Rhythm: normal sinus rhythm QRS Axis: normal Intervals: Borderline LVH ST/T Wave abnormalities: Some nonspecific ST and T wave changes but nothing to indicate ischemia Narrative Interpretation: no evidence of acute ischemia  ____________________________________________  RADIOLOGY I, Loleta Roseory Kathelene Rumberger, personally viewed and evaluated these images (plain radiographs) as part of my medical decision making, as well as reviewing the written report by the radiologist.  ED MD interpretation: Lungs are clear bilaterally with no acute abnormality.  Official radiology report(s): DG Chest 2 View  Result Date: 03/26/2021 CLINICAL DATA:  Shortness of breath.  Asthma. EXAM: CHEST - 2 VIEW COMPARISON:  Chest x-ray  12/24/2019 FINDINGS: The heart size and mediastinal contours are within normal limits. No focal consolidation. No pulmonary edema. No pleural effusion. No pneumothorax. No acute osseous abnormality. IMPRESSION: No active cardiopulmonary disease. Electronically Signed   By: Tish FredericksonMorgane  Naveau M.D.   On: 03/26/2021 23:59    ____________________________________________   PROCEDURES   Procedure(s) performed (including Critical Care):  .1-3 Lead EKG Interpretation  Date/Time: 03/27/2021 12:13 AM Performed by: Loleta RoseForbach, Jimmylee Ratterree, MD Authorized by: Loleta RoseForbach, Eliodoro Gullett, MD     Interpretation: normal     ECG rate:  86   ECG rate assessment: normal     Rhythm: sinus rhythm     Ectopy: none     Conduction: normal     ____________________________________________   INITIAL IMPRESSION / MDM / ASSESSMENT AND PLAN / ED COURSE  As part of my medical decision making, I reviewed the following data within the electronic MEDICAL RECORD NUMBER Nursing notes reviewed and incorporated, Labs reviewed , EKG interpreted , Old chart reviewed, Radiograph reviewed , and Notes from prior ED visits   Differential diagnosis includes, but is not limited to, asthma exacerbation, COVID-19, community-acquired pneumonia, less likely PE or ACS.  The patient is on the cardiac monitor to evaluate for evidence of arrhythmia and/or significant heart rate changes.  Patient's exam and history are consistent with asthma exacerbation.  However he reports to me that he is not vaccinated for COVID-19.  This could be a cause of his symptoms or acute exacerbation.  He is feeling a little bit better after using his inhalers but he is still working a little bit to breathe and is wheezing throughout.  I ordered standard lab work for dyspnea as well as a respiratory viral swab.  Duo nebs x3 and Solu-Medrol 125 mg IV.  Anticipate reassessment, hopefully outpatient treatment for asthma.  Patient understands and agrees with the plan.     Clinical  Course as of 03/27/21 0343  Sat Mar 27, 2021  0017 DG Chest 2 View I personally reviewed the patient's imaging and agree with the radiologist's interpretation that there are no acute abnormalities on the patient's chest x-ray. [CF]  0121 SARS Coronavirus 2 by RT PCR: NEGATIVE [CF]  0121 BNP, high-sensitivity troponin, respiratory viral panel, comprehensive metabolic panel, and CBC are all reassuring. [CF]  0121 I reassessed the patient and he is still using one of the DuoNebs but says he is feeling better. [CF]  3343 Patient feels much better.  No residual expiratory wheezing upon auscultation.  Patient is comfortable with the plan to go home with a prescription for prednisone.  I had my usual and customary asthma management discussion with him.  He will return to the ED if he develops new or worsening symptoms. [CF]    Clinical Course User Index [CF] Loleta Rose, MD     ____________________________________________  FINAL CLINICAL IMPRESSION(S) / ED DIAGNOSES  Final diagnoses:  Moderate asthma with exacerbation, unspecified whether persistent     MEDICATIONS GIVEN DURING THIS VISIT:  Medications  ipratropium-albuterol (DUONEB) 0.5-2.5 (3) MG/3ML nebulizer solution 3 mL (3 mLs Nebulization Given 03/27/21 0025)  ipratropium-albuterol (DUONEB) 0.5-2.5 (3) MG/3ML nebulizer solution 3 mL (3 mLs Nebulization Given 03/27/21 0025)  ipratropium-albuterol (DUONEB) 0.5-2.5 (3) MG/3ML nebulizer solution 3 mL (3 mLs Nebulization Given 03/27/21 0025)  methylPREDNISolone sodium succinate (SOLU-MEDROL) 125 mg/2 mL injection 125 mg (125 mg Intravenous Given 03/27/21 0024)     ED Discharge Orders          Ordered    predniSONE (DELTASONE) 20 MG tablet  Daily with breakfast        03/27/21 0342             Note:  This document was prepared using Dragon voice recognition software and may include unintentional dictation errors.   Loleta Rose, MD 03/27/21 [phone]

## 2021-03-27 NOTE — Discharge Instructions (Addendum)
We believe that your symptoms are caused today by an exacerbation of your asthma.  Please take the prescribed medications and any medications that you have at home.  Follow up with your doctor as recommended.  If you develop any new or worsening symptoms, including but not limited to fever, persistent vomiting, worsening shortness of breath, or other symptoms that concern you, please return to the Emergency Department immediately.  

## 2021-06-22 ENCOUNTER — Other Ambulatory Visit: Payer: BC Managed Care – PPO

## 2021-06-22 ENCOUNTER — Other Ambulatory Visit: Payer: Self-pay

## 2021-06-22 DIAGNOSIS — R972 Elevated prostate specific antigen [PSA]: Secondary | ICD-10-CM

## 2021-06-23 ENCOUNTER — Encounter: Payer: Self-pay | Admitting: *Deleted

## 2021-06-23 LAB — PSA: Prostate Specific Ag, Serum: 2.4 ng/mL (ref 0.0–4.0)

## 2021-10-12 ENCOUNTER — Other Ambulatory Visit: Payer: Self-pay | Admitting: Urology

## 2021-10-12 DIAGNOSIS — N401 Enlarged prostate with lower urinary tract symptoms: Secondary | ICD-10-CM

## 2021-12-16 ENCOUNTER — Other Ambulatory Visit: Payer: Self-pay

## 2021-12-16 DIAGNOSIS — R972 Elevated prostate specific antigen [PSA]: Secondary | ICD-10-CM

## 2021-12-17 ENCOUNTER — Other Ambulatory Visit: Payer: BC Managed Care – PPO

## 2021-12-17 DIAGNOSIS — R972 Elevated prostate specific antigen [PSA]: Secondary | ICD-10-CM

## 2021-12-18 LAB — PSA: Prostate Specific Ag, Serum: 1.8 ng/mL (ref 0.0–4.0)

## 2021-12-20 ENCOUNTER — Ambulatory Visit: Payer: Self-pay | Admitting: Urology

## 2021-12-27 ENCOUNTER — Ambulatory Visit (INDEPENDENT_AMBULATORY_CARE_PROVIDER_SITE_OTHER): Payer: BC Managed Care – PPO | Admitting: Urology

## 2021-12-27 ENCOUNTER — Encounter: Payer: Self-pay | Admitting: Urology

## 2021-12-27 VITALS — BP 144/81 | HR 96 | Ht 65.0 in | Wt 148.0 lb

## 2021-12-27 DIAGNOSIS — R972 Elevated prostate specific antigen [PSA]: Secondary | ICD-10-CM

## 2021-12-27 DIAGNOSIS — N401 Enlarged prostate with lower urinary tract symptoms: Secondary | ICD-10-CM | POA: Diagnosis not present

## 2021-12-27 DIAGNOSIS — Z87898 Personal history of other specified conditions: Secondary | ICD-10-CM

## 2021-12-27 NOTE — Progress Notes (Signed)
? ?12/27/2021 ?9:20 AM  ? ?Micheal Carr ?[birth year] ?[id number] ? ?Referring provider: Jerl Mina, MD ?[address] [place] ?[facility] ?[place],  [place] [address] ? ?Chief Complaint  ?Patient presents with  ? Elevated PSA  ? ? ?Urologic history: ?1.  Elevated PSA ?-Biopsy 04/2020 PSA 9.17; 39 g prostate ?-Pathology benign prostate tissue ? ?2.  BPH with LUTS ?-Tamsulosin 0.4 mg daily ? ?HPI: ?66 y.o. male presents for 6 month follow-up. ? ?Has noted some worsening urinary hesitancy and decreased stream since his last visit ?Does note he voids better when staying hydrated ?Denies dysuria, gross hematuria ?No flank, abdominal or pelvic pain ?PSA 12/17/2021 was 1.8 ? ? ? ? ?PMH: ?Past Medical History:  ?Diagnosis Date  ? Asthma   ? Chronic sinus infection   ? Heart murmur   ? History of kidney stones   ? ? ?Surgical History: ?Past Surgical History:  ?Procedure Laterality Date  ? COLONOSCOPY    ? COLONOSCOPY WITH PROPOFOL N/A 03/05/2021  ? Procedure: COLONOSCOPY WITH PROPOFOL;  Surgeon: Regis Bill, MD;  Location: [facility] ENDOSCOPY;  Service: Endoscopy;  Laterality: N/A;  ? FUNCTIONAL ENDOSCOPIC SINUS SURGERY    ? NASAL SEPTUM SURGERY    ? TONSILLECTOMY    ? trigger thumb Right   ? ? ?Home Medications:  ?Allergies as of 12/27/2021   ? ?   Reactions  ? Levofloxacin Other (See Comments)  ? Patient states his ENT gave him this med and he had "a little something kind of reaction" and was told not to take it again.  ? Sulfa Antibiotics Itching  ? ?  ? ?  ?Medication List  ?  ? ?  ? Accurate as of Dec 27, 2021  9:20 AM. If you have any questions, ask your nurse or doctor.  ?  ?  ? ?  ? ?albuterol 108 (90 Base) MCG/ACT inhaler ?Commonly known as: VENTOLIN HFA ?Inhale into the lungs. ?  ?ascorbic Acid 500 MG Cpcr ?Commonly known as: VITAMIN C ?Take 1,000 mg by mouth 2 (two) times daily. ?  ?budesonide-formoterol 160-4.5 MCG/ACT inhaler ?Commonly known as: SYMBICORT ?Inhale 2 puffs into the lungs in the morning and at  bedtime. ?  ?cetirizine 10 MG tablet ?Commonly known as: ZYRTEC ?Take 10 mg by mouth daily. ?  ?chlorhexidine 0.12 % solution ?Commonly known as: PERIDEX ?SMARTSIG:By Mouth ?  ?erythromycin ophthalmic ointment ?SMARTSIG:1 Sparingly Topical 3 Times Daily ?  ?gabapentin 300 MG capsule ?Commonly known as: NEURONTIN ?Take 300 mg by mouth 3 (three) times daily. ?  ?ibuprofen 400 MG tablet ?Commonly known as: ADVIL ?Take 400 mg by mouth 3 (three) times daily as needed. ?  ?montelukast 10 MG tablet ?Commonly known as: SINGULAIR ?TAKE 1 TABLET BY MOUTH EVERY DAY ?  ?Multi-Vitamin tablet ?Take 1 tablet by mouth daily. ?  ?omeprazole 40 MG capsule ?Commonly known as: PRILOSEC ?Take 40 mg by mouth daily. ?  ?pseudoephedrine 120 MG 12 hr tablet ?Commonly known as: SUDAFED ?Take 120 mg by mouth at bedtime. ?  ?tamsulosin 0.4 MG Caps capsule ?Commonly known as: FLOMAX ?TAKE 1 CAPSULE BY MOUTH EVERY DAY ?  ?Vitamin D3 125 MCG (5000 UT) Caps ?Take by mouth. ?  ?Xhance 93 MCG/ACT Exhu ?Generic drug: Fluticasone Propionate ?Place into the nose. ?  ? ?  ? ? ?Allergies:  ?Allergies  ?Allergen Reactions  ? Levofloxacin Other (See Comments)  ?  Patient states his ENT gave him this med and he had "a little something kind  of reaction" and was told not to take it again. ?  ? Sulfa Antibiotics Itching  ? ? ?Family History: ?Family History  ?Problem Relation Age of Onset  ? Alzheimer's disease Mother   ? Kidney disease Mother   ? Cancer Mother   ? Heart disease Mother   ? Dementia Father   ? Stroke Father   ? ? ?Social History:  reports that he has never smoked. He has never used smokeless tobacco. He reports that he does not drink alcohol and does not use drugs. ? ? ?Physical Exam: ?BP (!) 144/81   Pulse 96   Ht 5\' 5"  (1.651 m)   Wt 148 lb (67.1 kg)   BMI 24.63 kg/m?   ?Constitutional:  Alert and oriented, No acute distress. ?HEENT: [place] AT, moist mucus membranes.  Trachea midline, no masses. ?Cardiovascular: No clubbing, cyanosis, or  edema. ?Respiratory: Normal respiratory effort, no increased work of breathing. ?GU: Prostate 40 g, flat smooth without nodules or induration ?Psychiatric: Normal mood and affect. ? ? ?Assessment & Plan:   ? ?1.  History elevated PSA ?PSA in normal range; benign DRE ?1 year office follow-up for PSA/DRE ? ?2.  BPH with LUTS ?Stable ?Continue tamsulosin ? ? ? , MD ? ?[facility] ?[address] ?[place], [place] ?[phone] ?

## 2022-01-02 IMAGING — CR DG CHEST 2V
1 series · 2 of 2 positions shown · non-contrast
Comparison: Chest x-ray 12/24/2019

CLINICAL DATA: Shortness of breath.  Asthma.

EXAM:
CHEST - 2 VIEW

[Series 1: dg chest 2 view · 0.14mm/px · 2 of 2 slices shown]
[im 1/2]
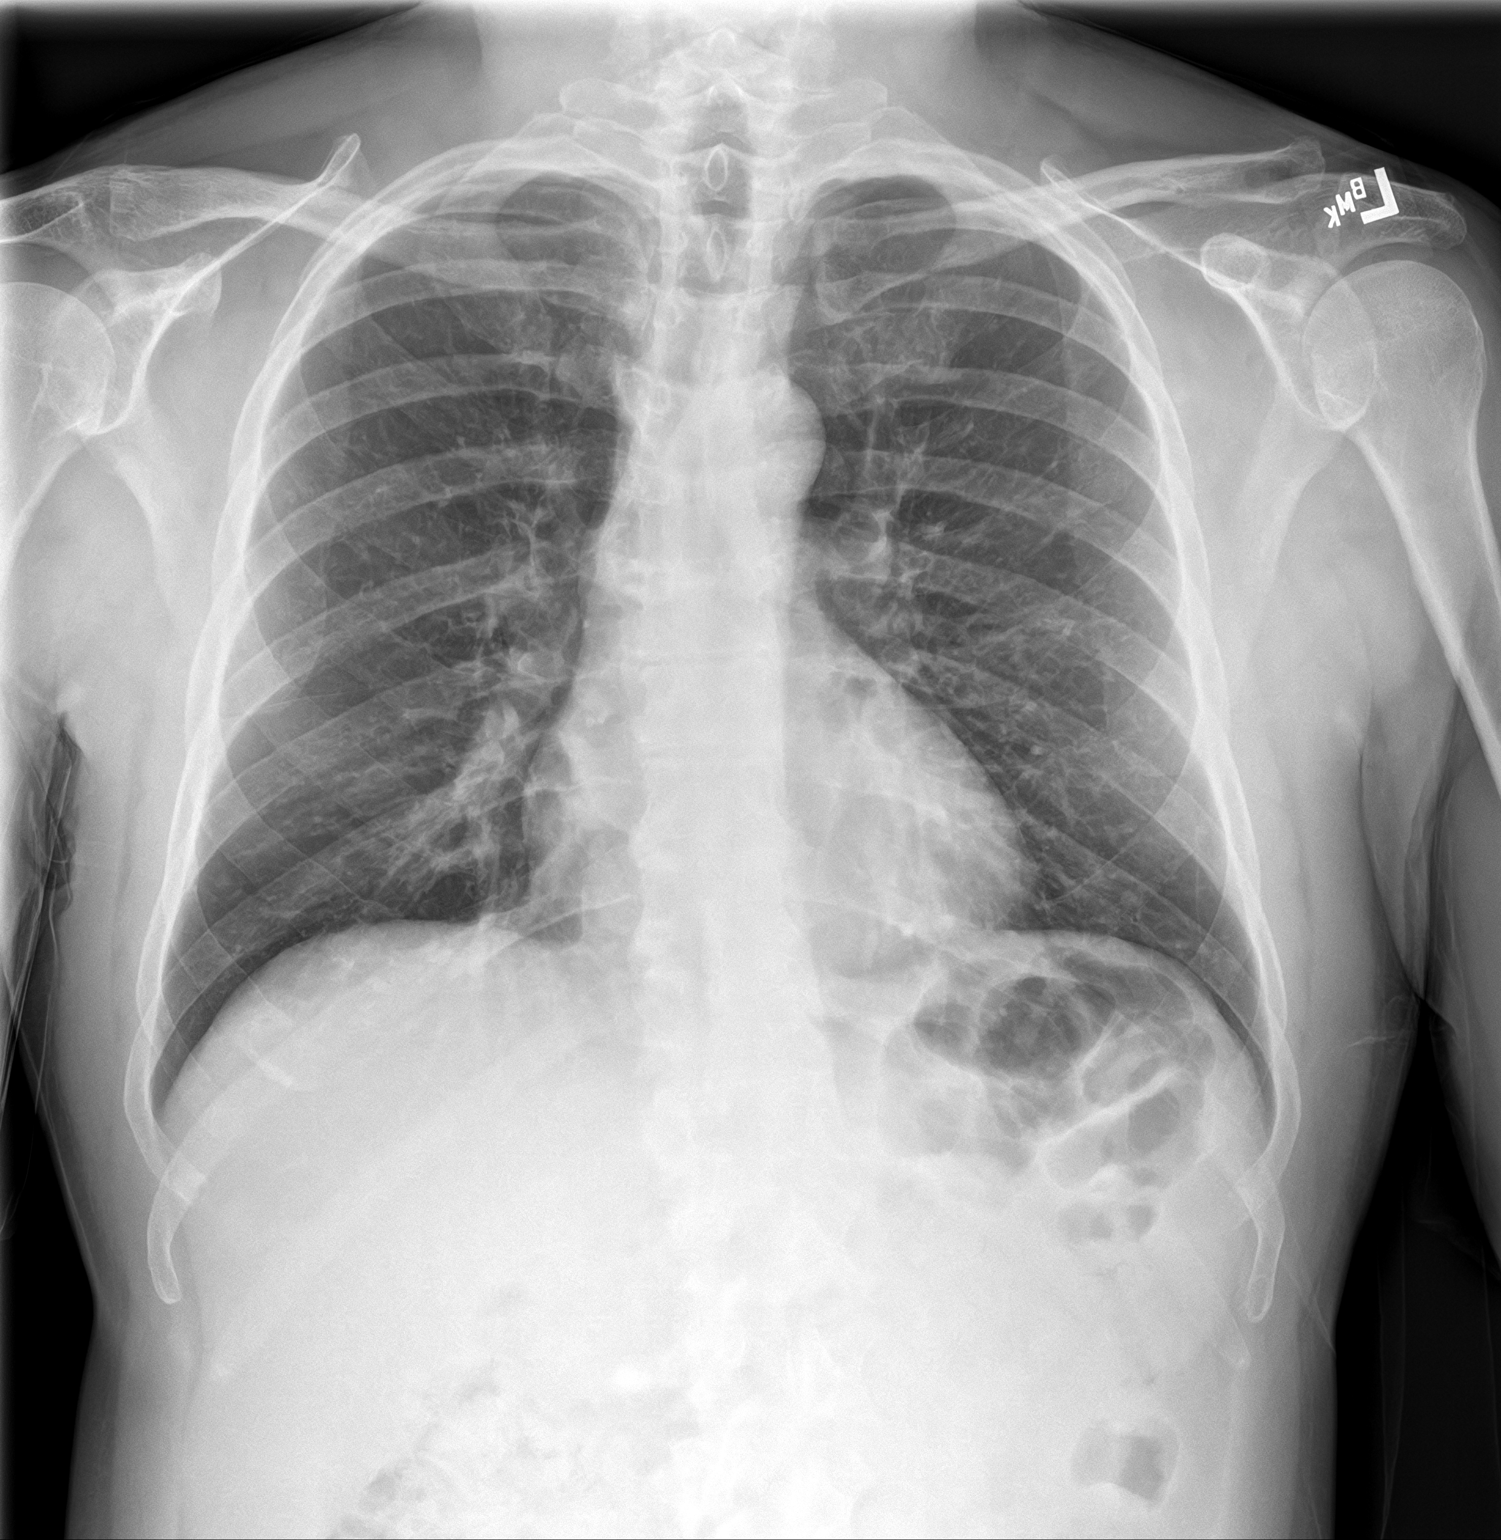
[im 2/2]
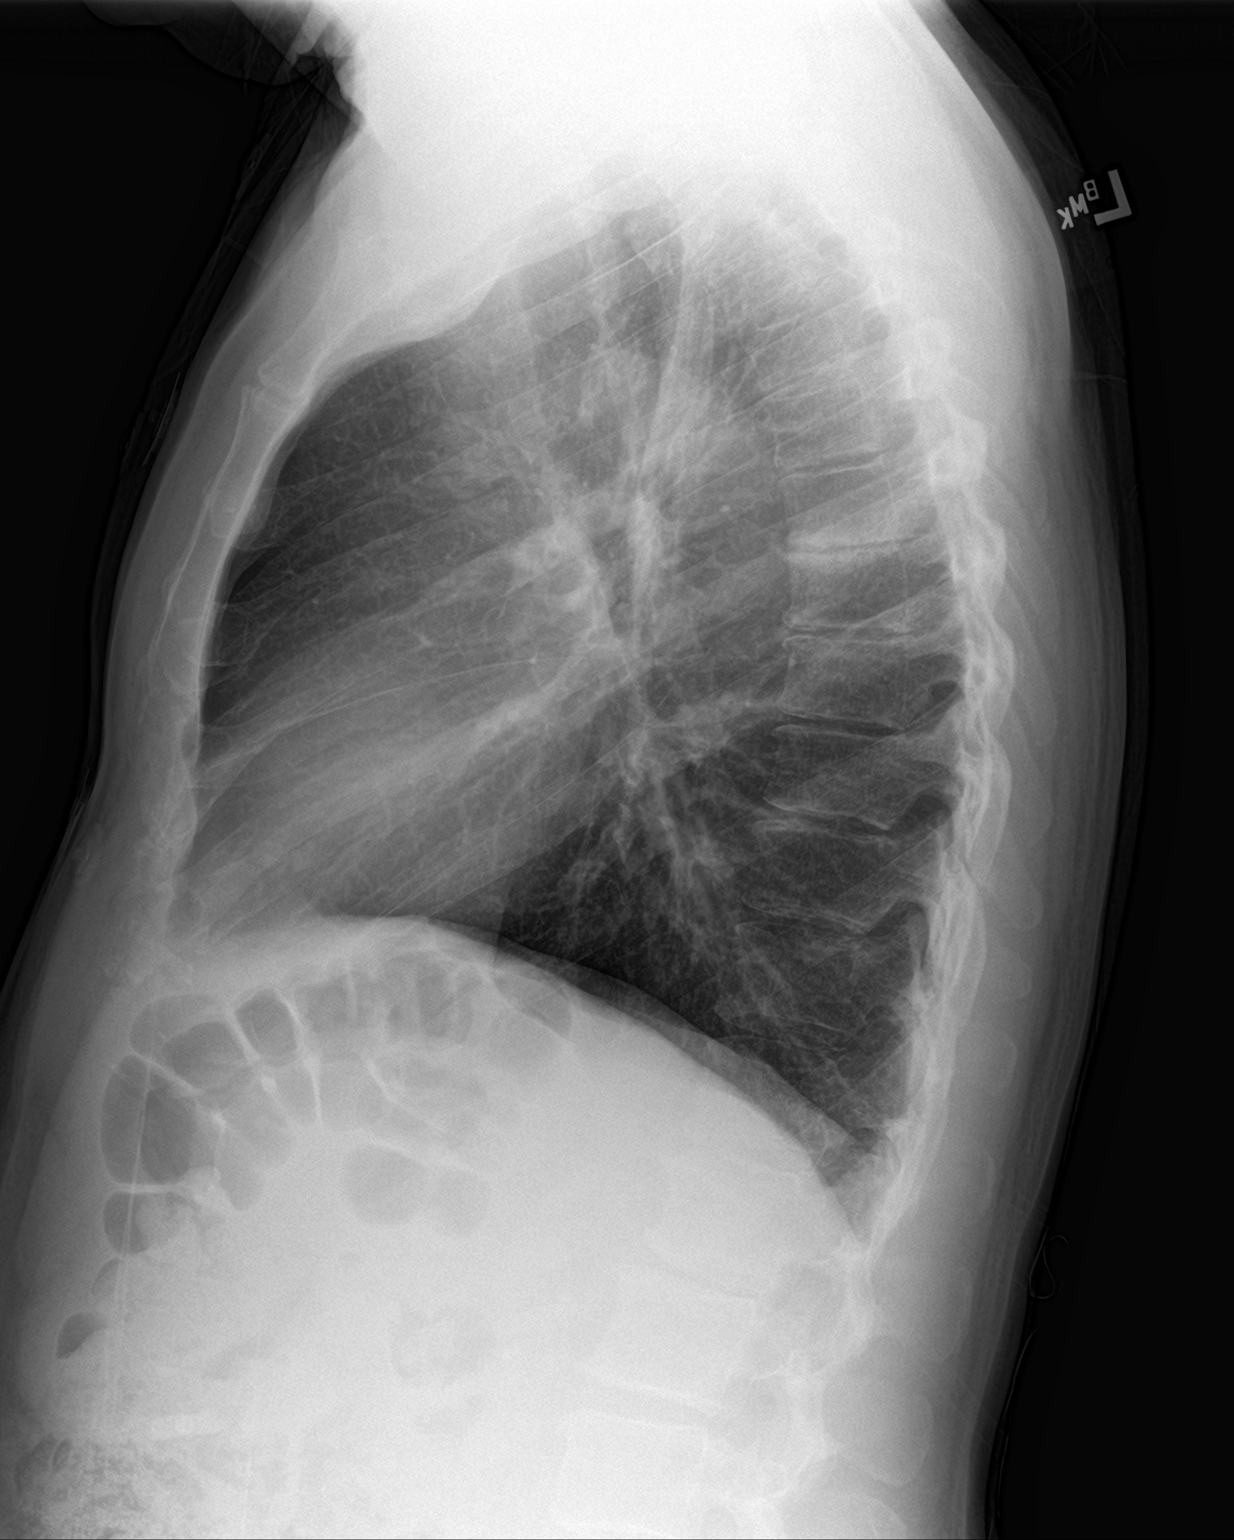

[2 of 2 positions shown; findings below may reference images not displayed]

FINDINGS: The heart size and mediastinal contours are within normal limits.

No focal consolidation. No pulmonary edema. No pleural effusion. No
pneumothorax.

No acute osseous abnormality.
IMPRESSION: No active cardiopulmonary disease.

## 2022-05-27 ENCOUNTER — Encounter: Payer: Self-pay | Admitting: Urology

## 2022-10-07 ENCOUNTER — Other Ambulatory Visit: Payer: Self-pay | Admitting: Urology

## 2022-10-07 DIAGNOSIS — N401 Enlarged prostate with lower urinary tract symptoms: Secondary | ICD-10-CM

## 2022-12-29 ENCOUNTER — Other Ambulatory Visit: Payer: Self-pay

## 2022-12-29 ENCOUNTER — Other Ambulatory Visit: Payer: BC Managed Care – PPO

## 2022-12-29 DIAGNOSIS — R972 Elevated prostate specific antigen [PSA]: Secondary | ICD-10-CM

## 2022-12-29 DIAGNOSIS — N401 Enlarged prostate with lower urinary tract symptoms: Secondary | ICD-10-CM

## 2022-12-30 ENCOUNTER — Ambulatory Visit: Payer: BC Managed Care – PPO | Admitting: Urology

## 2022-12-30 LAB — PSA: Prostate Specific Ag, Serum: 2.7 ng/mL (ref 0.0–4.0)

## 2023-01-13 ENCOUNTER — Ambulatory Visit: Payer: BC Managed Care – PPO | Admitting: Urology

## 2023-02-03 ENCOUNTER — Ambulatory Visit (INDEPENDENT_AMBULATORY_CARE_PROVIDER_SITE_OTHER): Payer: Medicare Other | Admitting: Urology

## 2023-02-03 ENCOUNTER — Encounter: Payer: Self-pay | Admitting: Urology

## 2023-02-03 VITALS — BP 143/75 | HR 76 | Ht 65.0 in | Wt 143.1 lb

## 2023-02-03 DIAGNOSIS — Z87898 Personal history of other specified conditions: Secondary | ICD-10-CM

## 2023-02-03 DIAGNOSIS — N401 Enlarged prostate with lower urinary tract symptoms: Secondary | ICD-10-CM | POA: Diagnosis not present

## 2023-02-03 DIAGNOSIS — R972 Elevated prostate specific antigen [PSA]: Secondary | ICD-10-CM

## 2023-02-03 MED ORDER — TAMSULOSIN HCL 0.4 MG PO CAPS: ORAL_CAPSULE | ORAL | 0 refills | Status: DC

## 2023-02-03 NOTE — Progress Notes (Signed)
Micheal Carr presents for an office visit. BP today is __143/75_________. He is  not on BP medication. Greater than 140/90. Provider  notified. Pt advised to__f/u with PCP____________. Pt voiced understanding.

## 2023-02-03 NOTE — Progress Notes (Signed)
I, Maysun L Gibbs,acting as a scribe for [name], MD.,have documented all relevant documentation on the behalf of [name], MD,as directed by  [name], MD while in the presence of [name], MD.  02/03/2023 11:17 AM   Micheal Carr 27-Jun-1956 [id number]  Referring provider: Jerl Mina, MD [address] [facility] [place],  [place] [address]  Chief Complaint  Patient presents with   Benign Prostatic Hypertrophy   history elevated psa   Urologic history: 1.  Elevated PSA Biopsy 04/2020 PSA 9.17; 39 g prostate Pathology benign prostate tissue   2.  BPH with LUTS Tamsulosin 0.4 mg daily  HPI: Micheal Carr is a 67 y.o. male presents for annual follow-up.   Since last year's visit, he has noted slight worsening in hesitancy and urgency. Denies dysuria, gross hematuria Denies flank, abdominal or pelvic pain  PSA 12/29/22 stable 2.7 ng/mL.  PSA trend  Prostate Specific Ag, Serum  Latest Ref Rng 0.0 - 4.0 ng/mL  05/11/2020 6.4 (H)   12/17/2020 6.4 (H)   06/22/2021 2.4   12/17/2021 1.8   12/29/2022 2.7      PMH: Past Medical History:  Diagnosis Date   Asthma    Chronic sinus infection    Heart murmur    History of kidney stones     Surgical History: Past Surgical History:  Procedure Laterality Date   COLONOSCOPY     COLONOSCOPY WITH PROPOFOL N/A 03/05/2021   Procedure: COLONOSCOPY WITH PROPOFOL;  Surgeon: Regis Bill, MD;  Location: [facility] ENDOSCOPY;  Service: Endoscopy;  Laterality: N/A;   FUNCTIONAL ENDOSCOPIC SINUS SURGERY     NASAL SEPTUM SURGERY     TONSILLECTOMY     trigger thumb Right     Home Medications:  Allergies as of 02/03/2023       Reactions   Levofloxacin Other (See Comments)   Patient states his ENT gave him this med and he had "a little something kind of reaction" and was told not to take it again.   Sulfa Antibiotics Itching        Medication List        Accurate as of February 03, 2023 11:17 AM. If you have any questions, ask your nurse or doctor.          STOP taking these medications    chlorhexidine 0.12 % solution Commonly known as: PERIDEX Stopped by: [name], MD   erythromycin ophthalmic ointment Stopped by: [name], MD   gabapentin 300 MG capsule Commonly known as: NEURONTIN Stopped by: [name], MD   ibuprofen 400 MG tablet Commonly known as: ADVIL Stopped by: [name], MD       TAKE these medications    albuterol 108 (90 Base) MCG/ACT inhaler Commonly known as: VENTOLIN HFA Inhale into the lungs.   ascorbic Acid 500 MG Cpcr Commonly known as: VITAMIN C Take 1,000 mg by mouth 2 (two) times daily.   budesonide-formoterol 160-4.5 MCG/ACT inhaler Commonly known as: SYMBICORT Inhale 2 puffs into the lungs in the morning and at bedtime.   cetirizine 10 MG tablet Commonly known as: ZYRTEC Take 10 mg by mouth daily.   montelukast 10 MG tablet Commonly known as: SINGULAIR TAKE 1 TABLET BY MOUTH EVERY DAY   Multi-Vitamin tablet Take 1 tablet by mouth daily.   omeprazole 40 MG capsule Commonly known as: PRILOSEC Take 40 mg by mouth daily.   pseudoephedrine  120 MG 12 hr tablet Commonly known as: SUDAFED Take 120 mg by mouth at bedtime.   tamsulosin 0.4 MG Caps capsule Commonly known as: FLOMAX Take 2 capsules once daily What changed:  how much to take how to take this when to take this additional instructions Changed by: [name], MD   Vitamin D3 125 MCG (5000 UT) Caps Take by mouth.   Xhance 93 MCG/ACT Exhu Generic drug: Fluticasone Propionate Place into the nose.        Allergies:  Allergies  Allergen Reactions   Levofloxacin Other (See Comments)    Patient states his ENT gave him this med and he had "a little something kind of reaction" and was told not to take it again.    Sulfa Antibiotics Itching    Family History: Family History  Problem Relation Age of Onset    Alzheimer's disease Mother    Kidney disease Mother    Cancer Mother    Heart disease Mother    Dementia Father    Stroke Father     Social History:  reports that he has never smoked. He has never used smokeless tobacco. He reports that he does not drink alcohol and does not use drugs.   Physical Exam: BP (!) 143/75   Pulse 76   Ht 5\' 5"  (1.651 m)   Wt 143 lb 1 oz (64.9 kg)   BMI 23.81 kg/m   Constitutional:  Alert and oriented, No acute distress. HEENT: [place] AT, moist mucus membranes.  Trachea midline, no masses. Cardiovascular: No clubbing, cyanosis, or edema. Respiratory: Normal respiratory effort, no increased work of breathing. GI: Abdomen is soft, nontender, nondistended, no abdominal masses GU: Prostate 40 grams, flat, smooth without nodules. Skin: No rashes, bruises or suspicious lesions. Neurologic: Grossly intact, no focal deficits, moving all 4 extremities. Psychiatric: Normal mood and affect.   Assessment & Plan:    1. History elevated PSA Benign DRE PSA stable, 2.7 Continue annual follow-up  2. BPH with LUTS  Slight worsening of symptoms and was interested in a 34-month trial of Tamsulosin 0.4 mg twice daily.  He will call back regarding efficacy.  If no significant improvement in his symptoms, we discussed adding a 5-ARI.   I have reviewed the above documentation for accuracy and completeness, and I agree with the above.   [name], MD  [facility] [address] [place], [place] [address] [phone]

## 2023-04-05 ENCOUNTER — Other Ambulatory Visit: Payer: Self-pay

## 2023-04-05 DIAGNOSIS — N401 Enlarged prostate with lower urinary tract symptoms: Secondary | ICD-10-CM

## 2023-04-05 MED ORDER — TAMSULOSIN HCL 0.4 MG PO CAPS: ORAL_CAPSULE | ORAL | 3 refills | Status: DC

## 2023-04-05 NOTE — Telephone Encounter (Signed)
Pt states 2 daily works well.   Refill erxed.

## 2023-06-19 ENCOUNTER — Other Ambulatory Visit: Payer: Self-pay | Admitting: Physical Medicine & Rehabilitation

## 2023-06-19 DIAGNOSIS — M5441 Lumbago with sciatica, right side: Secondary | ICD-10-CM

## 2023-06-28 ENCOUNTER — Ambulatory Visit: Payer: Medicare Other | Admitting: Urology

## 2023-06-28 ENCOUNTER — Encounter: Payer: Self-pay | Admitting: Urology

## 2023-06-28 VITALS — BP 161/83 | HR 89 | Ht 66.0 in | Wt 148.0 lb

## 2023-06-28 DIAGNOSIS — N5089 Other specified disorders of the male genital organs: Secondary | ICD-10-CM | POA: Diagnosis not present

## 2023-06-28 DIAGNOSIS — R102 Pelvic and perineal pain: Secondary | ICD-10-CM | POA: Diagnosis not present

## 2023-06-28 DIAGNOSIS — N401 Enlarged prostate with lower urinary tract symptoms: Secondary | ICD-10-CM

## 2023-06-28 DIAGNOSIS — M545 Low back pain, unspecified: Secondary | ICD-10-CM | POA: Diagnosis not present

## 2023-06-28 LAB — URINALYSIS, COMPLETE
Bilirubin, UA: NEGATIVE
Glucose, UA: NEGATIVE
Ketones, UA: NEGATIVE
Leukocytes,UA: NEGATIVE
Nitrite, UA: NEGATIVE
Protein,UA: NEGATIVE
RBC, UA: NEGATIVE
Specific Gravity, UA: 1.015 (ref 1.005–1.030)
Urobilinogen, Ur: 0.2 mg/dL (ref 0.2–1.0)
pH, UA: 6 (ref 5.0–7.5)

## 2023-06-28 LAB — MICROSCOPIC EXAMINATION: Bacteria, UA: NONE SEEN

## 2023-06-28 NOTE — Progress Notes (Signed)
I, Micheal Carr, acting as a scribe for Micheal Altes, MD., have documented all relevant documentation on the behalf of Micheal Altes, MD, as directed by Micheal Altes, MD while in the presence of Micheal Altes, MD.  06/28/2023 4:28 PM   [name] Jan 21, 1956 [id number]  Referring provider: Jerl Mina, MD [address] [facility] [place],  [place] [address]  Chief Complaint  Patient presents with   Benign Prostatic Hypertrophy   Urologic history: 1.  Elevated PSA Biopsy 04/2020 PSA 9.17; 39 g prostate Pathology benign prostate tissue   2.  BPH with LUTS Tamsulosin 0.4 mg daily  HPI: Micheal Carr is a 67 y.o. male presents for pelvic pain and bothersome lower urinary tract symptoms.  Seen for his annual visit early June 2024 and was doing well. He had noted slight worsening of his lower urinary tract symptoms and tamsulosin was increased to 0.8 mg which he felt was effective.  He presents today with a 6 week history of intermittent bilateral scrotal pain, which can be severe at times. He has also noted bilateral back pain and bothersome storage-related voiding symptoms including urgency, frequency. He does note some urinary hesitancy and a weak urinary stream.  Denies dysuria or gross hematuria.  Remains on tamsulosin.  [facility] ED visit 05/26/23 for evaluation of these symptoms and was discharged with a diagnosis of BPH. Urinanalysis was negative; a ultrasound of the scrotum was performed which showed no abnormalities. He had no significant PVR.   PMH: Past Medical History:  Diagnosis Date   Asthma    Chronic sinus infection    Heart murmur    History of kidney stones     Surgical History: Past Surgical History:  Procedure Laterality Date   COLONOSCOPY     COLONOSCOPY WITH PROPOFOL N/A 03/05/2021   Procedure: COLONOSCOPY WITH PROPOFOL;  Surgeon: Regis Bill, MD;  Location: [facility] ENDOSCOPY;  Service: Endoscopy;  Laterality: N/A;    FUNCTIONAL ENDOSCOPIC SINUS SURGERY     NASAL SEPTUM SURGERY     TONSILLECTOMY     trigger thumb Right     Home Medications:  Allergies as of 06/28/2023       Reactions   Levofloxacin Other (See Comments)   Patient states his ENT gave him this med and he had "a little something kind of reaction" and was told not to take it again.   Sulfa Antibiotics Itching        Medication List        Accurate as of June 28, 2023  4:28 PM. If you have any questions, ask your nurse or doctor.          albuterol 108 (90 Base) MCG/ACT inhaler Commonly known as: VENTOLIN HFA Inhale into the lungs.   ascorbic Acid 500 MG Cpcr Commonly known as: VITAMIN C Take 1,000 mg by mouth 2 (two) times daily.   budesonide-formoterol 160-4.5 MCG/ACT inhaler Commonly known as: SYMBICORT Inhale 2 puffs into the lungs in the morning and at bedtime.   cetirizine 10 MG tablet Commonly known as: ZYRTEC Take 10 mg by mouth daily.   montelukast 10 MG tablet Commonly known as: SINGULAIR TAKE 1 TABLET BY MOUTH EVERY DAY   Multi-Vitamin tablet Take 1 tablet by mouth daily.   omeprazole 40 MG capsule Commonly known as: PRILOSEC Take 40 mg by mouth daily.   pseudoephedrine 120 MG 12 hr tablet Commonly known as: SUDAFED Take 120 mg by mouth  at bedtime.   tamsulosin 0.4 MG Caps capsule Commonly known as: FLOMAX Take 2 capsules once daily   Vitamin D3 125 MCG (5000 UT) Caps Take by mouth.   Xhance 93 MCG/ACT Exhu Generic drug: Fluticasone Propionate Place into the nose.        Allergies:  Allergies  Allergen Reactions   Levofloxacin Other (See Comments)    Patient states his ENT gave him this med and he had "a little something kind of reaction" and was told not to take it again.    Sulfa Antibiotics Itching    Family History: Family History  Problem Relation Age of Onset   Alzheimer's disease Mother    Kidney disease Mother    Cancer Mother    Heart disease Mother     Dementia Father    Stroke Father     Social History:  reports that he has never smoked. He has never used smokeless tobacco. He reports that he does not drink alcohol and does not use drugs.   Physical Exam: BP (!) 161/83   Pulse 89   Ht 5\' 6"  (1.676 m)   Wt 148 lb (67.1 kg)   BMI 23.89 kg/m   Constitutional:  Alert, No acute distress. HEENT: Micheal Carr AT Respiratory: Normal respiratory effort, no increased work of breathing. Psychiatric: Normal mood and affect.   Assessment & Plan:    6 week history of scrotal, low back pain, and worsening lower urinary tract symptoms.  Urinalysis today was clear. He has a history of stone disease and we discussed the possibility of a ureteral calculus accounting for his symptoms and recommend scheduling a stone protocol CT.  He has also had recent increasing back pain and is scheduled for an MRI. We also discussed the possibility of a neuropathic pain and voiding symptoms.  He will be contacted with his CT results.   I have reviewed the above documentation for accuracy and completeness, and I agree with the above.   Micheal Altes, MD  [facility] [address], [address] [place], [place] [address] [phone]

## 2023-07-06 ENCOUNTER — Other Ambulatory Visit: Payer: BC Managed Care – PPO

## 2023-07-14 ENCOUNTER — Encounter: Payer: Self-pay | Admitting: Physical Medicine & Rehabilitation

## 2023-07-20 ENCOUNTER — Ambulatory Visit
Admission: RE | Admit: 2023-07-20 | Discharge: 2023-07-20 | Disposition: A | Discharge status: Home / Self Care | Payer: Medicare Other | Source: Ambulatory Visit | Attending: Physical Medicine & Rehabilitation | Admitting: Physical Medicine & Rehabilitation

## 2023-07-20 DIAGNOSIS — M5441 Lumbago with sciatica, right side: Secondary | ICD-10-CM

## 2023-08-25 ENCOUNTER — Ambulatory Visit
Admission: RE | Admit: 2023-08-25 | Discharge: 2023-08-25 | Disposition: A | Discharge status: Home / Self Care | Payer: Medicare Other | Source: Ambulatory Visit | Attending: Urology | Admitting: Urology

## 2023-08-25 DIAGNOSIS — R102 Pelvic and perineal pain: Secondary | ICD-10-CM | POA: Diagnosis present

## 2023-10-23 NOTE — Progress Notes (Unsigned)
 10/24/2023 3:00 PM   Micheal Carr [birth year] [id number]  Referring provider: Jerl Mina, MD [address] [place],  [place] [address]  Urological history: 1. Elevated PSA -Biopsy 04/2020 PSA 9.17; 39 g prostate -Pathology benign prostate tissue   2.  BPH with LU TS -PSA (12/2022) 2.7 -Tamsulosin 0.4 mg daily  No chief complaint on file.  HPI: Micheal Carr is a 68 y.o. male who presents today for right testicle pain.   Previous records reviewed.   He had a scrotal ultrasound back in September 2024 with [facility] which noted bilateral epididymal head cysts, no hydroceles or varicoceles noted.    UA ***   PVR ***    PMH: Past Medical History:  Diagnosis Date   Asthma    Chronic sinus infection    Heart murmur    History of kidney stones     Surgical History: Past Surgical History:  Procedure Laterality Date   COLONOSCOPY     COLONOSCOPY WITH PROPOFOL N/A 03/05/2021   Procedure: COLONOSCOPY WITH PROPOFOL;  Surgeon: Regis Bill, MD;  Location: [facility] ENDOSCOPY;  Service: Endoscopy;  Laterality: N/A;   FUNCTIONAL ENDOSCOPIC SINUS SURGERY     NASAL SEPTUM SURGERY     TONSILLECTOMY     trigger thumb Right     Home Medications:  Allergies as of 10/24/2023       Reactions   Levofloxacin Other (See Comments)   Patient states his ENT gave him this med and he had "a little something kind of reaction" and was told not to take it again.   Sulfa Antibiotics Itching        Medication List        Accurate as of October 23, 2023  3:00 PM. If you have any questions, ask your nurse or doctor.          albuterol 108 (90 Base) MCG/ACT inhaler Commonly known as: VENTOLIN HFA Inhale into the lungs.   ascorbic Acid 500 MG Cpcr Commonly known as: VITAMIN C Take 1,000 mg by mouth 2 (two) times daily.   budesonide-formoterol 160-4.5 MCG/ACT inhaler Commonly known as: SYMBICORT Inhale 2 puffs into the lungs in the  morning and at bedtime.   cetirizine 10 MG tablet Commonly known as: ZYRTEC Take 10 mg by mouth daily.   montelukast 10 MG tablet Commonly known as: SINGULAIR TAKE 1 TABLET BY MOUTH EVERY DAY   Multi-Vitamin tablet Take 1 tablet by mouth daily.   omeprazole 40 MG capsule Commonly known as: PRILOSEC Take 40 mg by mouth daily.   pseudoephedrine 120 MG 12 hr tablet Commonly known as: SUDAFED Take 120 mg by mouth at bedtime.   tamsulosin 0.4 MG Caps capsule Commonly known as: FLOMAX Take 2 capsules once daily   Vitamin D3 125 MCG (5000 UT) Caps Take by mouth.   Xhance 93 MCG/ACT Exhu Generic drug: Fluticasone Propionate Place into the nose.        Allergies:  Allergies  Allergen Reactions   Levofloxacin Other (See Comments)    Patient states his ENT gave him this med and he had "a little something kind of reaction" and was told not to take it again.    Sulfa Antibiotics Itching    Family History: Family History  Problem Relation Age of Onset   Alzheimer's disease Mother    Kidney disease Mother    Cancer Mother    Heart disease Mother    Dementia Father  Stroke Father     Social History:  reports that he has never smoked. He has never used smokeless tobacco. He reports that he does not drink alcohol and does not use drugs.  ROS: Pertinent ROS in HPI  Physical Exam: There were no vitals taken for this visit.  Constitutional:  Well nourished. Alert and oriented, No acute distress. HEENT: [place] AT, moist mucus membranes.  Trachea midline, no masses. Cardiovascular: No clubbing, cyanosis, or edema. Respiratory: Normal respiratory effort, no increased work of breathing. GI: Abdomen is soft, non tender, non distended, no abdominal masses. Liver and spleen not palpable.  No hernias appreciated.  Stool sample for occult testing is not indicated.   GU: No CVA tenderness.  No bladder fullness or masses.  Patient with circumcised/uncircumcised phallus. ***Foreskin  easily retracted***  Urethral meatus is patent.  No penile discharge. No penile lesions or rashes. Scrotum without lesions, cysts, rashes and/or edema.  Testicles are located scrotally bilaterally. No masses are appreciated in the testicles. Left and right epididymis are normal. Rectal: Patient with  normal sphincter tone. Anus and perineum without scarring or rashes. No rectal masses are appreciated. Prostate is approximately *** grams, *** nodules are appreciated. Seminal vesicles are normal. Skin: No rashes, bruises or suspicious lesions. Lymph: No cervical or inguinal adenopathy. Neurologic: Grossly intact, no focal deficits, moving all 4 extremities. Psychiatric: Normal mood and affect.  Laboratory Data: Urinalysis See EPIC and HPI  I have reviewed the labs.   Pertinent Imaging: ***  Assessment & Plan:  ***  1.   No follow-ups on file.  These notes generated with voice recognition software. I apologize for typographical errors.  [name]  [facility] [address]  [address] [place], [place] [address] [phone]

## 2023-10-24 ENCOUNTER — Encounter: Payer: Self-pay | Admitting: Urology

## 2023-10-24 ENCOUNTER — Ambulatory Visit (INDEPENDENT_AMBULATORY_CARE_PROVIDER_SITE_OTHER): Payer: Medicare Other | Admitting: Urology

## 2023-10-24 VITALS — BP 150/88 | HR 90 | Ht 66.0 in | Wt 148.0 lb

## 2023-10-24 DIAGNOSIS — N451 Epididymitis: Secondary | ICD-10-CM | POA: Diagnosis not present

## 2023-10-24 DIAGNOSIS — N401 Enlarged prostate with lower urinary tract symptoms: Secondary | ICD-10-CM | POA: Diagnosis not present

## 2023-10-24 DIAGNOSIS — N419 Inflammatory disease of prostate, unspecified: Secondary | ICD-10-CM

## 2023-10-24 LAB — URINALYSIS, COMPLETE
Bilirubin, UA: NEGATIVE
Glucose, UA: NEGATIVE
Ketones, UA: NEGATIVE
Leukocytes,UA: NEGATIVE
Nitrite, UA: NEGATIVE
Protein,UA: NEGATIVE
RBC, UA: NEGATIVE
Specific Gravity, UA: 1.01 (ref 1.005–1.030)
Urobilinogen, Ur: 0.2 mg/dL (ref 0.2–1.0)
pH, UA: 7 (ref 5.0–7.5)

## 2023-10-24 LAB — MICROSCOPIC EXAMINATION
Bacteria, UA: NONE SEEN
RBC, Urine: NONE SEEN /HPF (ref 0–2)

## 2023-10-24 LAB — BLADDER SCAN AMB NON-IMAGING: Scan Result: 23

## 2023-10-24 MED ORDER — DOXYCYCLINE HYCLATE 100 MG PO CAPS: 100.0000 mg | ORAL_CAPSULE | Freq: Two times a day (BID) | ORAL | 0 refills | Status: DC

## 2023-10-27 LAB — CULTURE, URINE COMPREHENSIVE

## 2023-11-15 ENCOUNTER — Ambulatory Visit: Payer: Medicare Other | Admitting: Dermatology

## 2023-11-16 ENCOUNTER — Ambulatory Visit: Admitting: Dermatology

## 2023-11-16 DIAGNOSIS — L568 Other specified acute skin changes due to ultraviolet radiation: Secondary | ICD-10-CM | POA: Diagnosis not present

## 2023-11-16 DIAGNOSIS — W908XXA Exposure to other nonionizing radiation, initial encounter: Secondary | ICD-10-CM

## 2023-11-16 DIAGNOSIS — L821 Other seborrheic keratosis: Secondary | ICD-10-CM | POA: Diagnosis not present

## 2023-11-16 DIAGNOSIS — L820 Inflamed seborrheic keratosis: Secondary | ICD-10-CM | POA: Diagnosis not present

## 2023-11-16 DIAGNOSIS — T50905A Adverse effect of unspecified drugs, medicaments and biological substances, initial encounter: Secondary | ICD-10-CM

## 2023-11-16 DIAGNOSIS — L578 Other skin changes due to chronic exposure to nonionizing radiation: Secondary | ICD-10-CM

## 2023-11-16 DIAGNOSIS — X32XXXA Exposure to sunlight, initial encounter: Secondary | ICD-10-CM

## 2023-11-16 NOTE — Patient Instructions (Signed)

## 2023-11-16 NOTE — Progress Notes (Signed)
   Follow-Up Visit   Subjective  Micheal Carr is a 68 y.o. male who presents for the following: Lesions on the L and R temple, scaly, persistent for several months. Pt c/o itching on the face and thinks it may be from Doxycycline and sun exposure.  The patient has spots, moles and lesions to be evaluated, some may be new or changing and the patient may have concern these could be cancer.   The following portions of the chart were reviewed this encounter and updated as appropriate: medications, allergies, medical history  Review of Systems:  No other skin or systemic complaints except as noted in HPI or Assessment and Plan.  Objective  Well appearing patient in no apparent distress; mood and affect are within normal limits.   A focused examination was performed of the following areas: the face and scalp   Relevant exam findings are noted in the Assessment and Plan.  L temple/hair line x 2, R temple/hair line x 1 (3) Erythematous stuck-on, waxy macule/thin papule  Assessment & Plan   ACTINIC DAMAGE - chronic, secondary to cumulative UV radiation exposure/sun exposure over time - diffuse scaly erythematous macules with underlying dyspigmentation - Recommend daily broad spectrum sunscreen SPF 30+ to sun-exposed areas, reapply every 2 hours as needed.  - Recommend staying in the shade or wearing long sleeves, sun glasses (UVA+UVB protection) and wide brim hats (4-inch brim around the entire circumference of the hat). - Call for new or changing lesions.  SEBORRHEIC KERATOSIS - Stuck-on, waxy, tan-brown papules and/or plaques  - Benign-appearing - Discussed benign etiology and prognosis. - Observe - Call for any changes INFLAMED SEBORRHEIC KERATOSIS (3) L temple/hair line x 2, R temple/hair line x 1 (3) Symptomatic, irritating, patient would like treated.  Destruction of lesion - L temple/hair line x 2, R temple/hair line x 1 (3)  Destruction method: cryotherapy    Informed consent: discussed and consent obtained   Lesion destroyed using liquid nitrogen: Yes   Region frozen until ice ball extended beyond lesion: Yes   Outcome: patient tolerated procedure well with no complications   Post-procedure details: wound care instructions given   Additional details:  Prior to procedure, discussed risks of blister formation, small wound, skin dyspigmentation, or rare scar following cryotherapy. Recommend Vaseline ointment to treated areas while healing.   Photosensitivity of the face- likely from Doxycycline and recent sun exposure  Exam: mild erythema cheeks   Treatment Plan:  Benign, observe.   Recommend daily broad spectrum sunscreen SPF 30+ to sun-exposed areas, reapply every 2 hours as needed. Call for new or changing lesions.  Staying in the shade or wearing long sleeves, sun glasses (UVA+UVB protection) and wide brim hats (4-inch brim around the entire circumference of the hat) are also recommended for sun protection.     Return if symptoms worsen or fail to improve, for ISK recheck.  Maylene Roes, CMA, am acting as scribe for Willeen Niece, MD .  Documentation: I have reviewed the above documentation for accuracy and completeness, and I agree with the above.  Willeen Niece, MD

## 2023-11-17 ENCOUNTER — Telehealth: Payer: Self-pay

## 2023-11-17 NOTE — Telephone Encounter (Signed)
 Patient advised.

## 2023-11-17 NOTE — Telephone Encounter (Signed)
 Patient calling stating that he has really noticed since yesterday 11/16/2023 that he has hard time focusing when he is reading something or trying to see something in a distance. Just a little blurred vision not severe, like a light film over his eyes. No other symptoms, no cardiac symptoms and wonders if it is from Doxy that he has been taking since 10/24/23. Advised I would let Carollee Herter know but to also call his PCP and possibly cardiologist as this could be unrelated .  Next OV is on 11/21/23 for follow up

## 2023-11-20 NOTE — Progress Notes (Unsigned)
 11/21/2023 9:28 PM   Micheal Carr December 10, 1955 [id number]  Referring provider: Jerl Mina, MD [address] [place],  [place] [address]  Urological history: 1. Elevated PSA -Biopsy 04/2020 PSA 9.17; 39 g prostate -Pathology benign prostate tissue   2.  BPH with LU TS -PSA (12/2022) 2.7 -Tamsulosin 0.4 mg daily  No chief complaint on file.  HPI: Micheal Carr is a 68 y.o. male who presents today for one month follow up.    Previous records reviewed.   At his visit on 10/24/2023, He had a scrotal ultrasound back in September 2024 with [facility] which noted bilateral epididymal head cysts, no hydroceles or varicoceles noted.   He has been having right sided testicular pain since Christmas which they first thought was due to his back issues.  But he states a week or 2 ago it became more concentrated to the right testicle, testicle became extremely tender to touch especially on the backside.  He did not notice any testicular swelling or masses.  He is also had some dysuria for 3 to 4 days.  He has also had issues with the tamsulosin not working.  He states even prior to the testicular pain, he has had to strain to urinate.  Patient denies any modifying or aggravating factors.  Patient denies any recent UTI's, gross hematuria or suprapubic/flank pain.  Patient denies any fevers, chills, nausea or vomiting.  UA yellow clear, specific gravity 1.010, pH 7.0, WBC 0-5 and epithelial cells 0-10.  PVR 23 mL  He was started on doxycycline and his urine culture was negative.  He contacted the office on 11/17/2023 stating that he was having a hard time focusing when reading or trying to see something in the distance with blurred vision.  He was advised to contact his PCP or eye doctor.    PMH: Past Medical History:  Diagnosis Date   Asthma    Chronic sinus infection    Heart murmur    History of kidney stones     Surgical History: Past Surgical History:   Procedure Laterality Date   COLONOSCOPY     COLONOSCOPY WITH PROPOFOL N/A 03/05/2021   Procedure: COLONOSCOPY WITH PROPOFOL;  Surgeon: Regis Bill, MD;  Location: [facility] ENDOSCOPY;  Service: Endoscopy;  Laterality: N/A;   FUNCTIONAL ENDOSCOPIC SINUS SURGERY     NASAL SEPTUM SURGERY     TONSILLECTOMY     trigger thumb Right     Home Medications:  Allergies as of 11/21/2023       Reactions   Levofloxacin Other (See Comments)   Patient states his ENT gave him this med and he had "a little something kind of reaction" and was told not to take it again.   Sulfa Antibiotics Itching        Medication List        Accurate as of November 20, 2023  9:28 PM. If you have any questions, ask your nurse or doctor.          albuterol 108 (90 Base) MCG/ACT inhaler Commonly known as: VENTOLIN HFA Inhale into the lungs.   ascorbic Acid 500 MG Cpcr Commonly known as: VITAMIN C Take 1,000 mg by mouth 2 (two) times daily.   budesonide-formoterol 160-4.5 MCG/ACT inhaler Commonly known as: SYMBICORT Inhale 2 puffs into the lungs in the morning and at bedtime.   cetirizine 10 MG tablet Commonly known as: ZYRTEC Take 10 mg by mouth daily.   doxycycline 100  MG capsule Commonly known as: VIBRAMYCIN Take 1 capsule (100 mg total) by mouth every 12 (twelve) hours.   Garlic 100 MG Tabs Take by mouth.   montelukast 10 MG tablet Commonly known as: SINGULAIR TAKE 1 TABLET BY MOUTH EVERY DAY   Multi-Vitamin tablet Take 1 tablet by mouth daily.   omeprazole 40 MG capsule Commonly known as: PRILOSEC Take 40 mg by mouth daily.   pseudoephedrine 120 MG 12 hr tablet Commonly known as: SUDAFED Take 120 mg by mouth at bedtime.   tamsulosin 0.4 MG Caps capsule Commonly known as: FLOMAX Take 2 capsules once daily   Vitamin D3 125 MCG (5000 UT) Caps Take by mouth.   Xhance 93 MCG/ACT Exhu Generic drug: Fluticasone Propionate Place into the nose.        Allergies:  Allergies   Allergen Reactions   Levofloxacin Other (See Comments)    Patient states his ENT gave him this med and he had "a little something kind of reaction" and was told not to take it again.    Sulfa Antibiotics Itching    Family History: Family History  Problem Relation Age of Onset   Alzheimer's disease Mother    Kidney disease Mother    Cancer Mother    Heart disease Mother    Dementia Father    Stroke Father     Social History:  reports that he has never smoked. He has never used smokeless tobacco. He reports that he does not drink alcohol and does not use drugs.  ROS: Pertinent ROS in HPI  Physical Exam: There were no vitals taken for this visit.  Constitutional:  Well nourished. Alert and oriented, No acute distress. HEENT: [place] AT, moist mucus membranes.  Trachea midline, no masses. Cardiovascular: No clubbing, cyanosis, or edema. Respiratory: Normal respiratory effort, no increased work of breathing. GI: Abdomen is soft, non tender, non distended, no abdominal masses. Liver and spleen not palpable.  No hernias appreciated.  Stool sample for occult testing is not indicated.   GU: No CVA tenderness.  No bladder fullness or masses.  Patient with circumcised/uncircumcised phallus. ***Foreskin easily retracted***  Urethral meatus is patent.  No penile discharge. No penile lesions or rashes. Scrotum without lesions, cysts, rashes and/or edema.  Testicles are located scrotally bilaterally. No masses are appreciated in the testicles. Left and right epididymis are normal. Rectal: Patient with  normal sphincter tone. Anus and perineum without scarring or rashes. No rectal masses are appreciated. Prostate is approximately *** grams, *** nodules are appreciated. Seminal vesicles are normal. Skin: No rashes, bruises or suspicious lesions. Lymph: No cervical or inguinal adenopathy. Neurologic: Grossly intact, no focal deficits, moving all 4 extremities. Psychiatric: Normal mood and affect.    Laboratory Data: N/A  Pertinent Imaging: N/A   Assessment & Plan:    1. Prostatitis -tender in the right lobe of prostate -Start doxycycline 100 mg twice daily for 30 days  2.  Right epididymitis -Right epididymis indurated -Doxy prescribed  3. BPH with LUTS -PSA stable  -DRE enlarged  -UA benign  -PVR < 300 cc  -most bothersome symptoms are straining to void  -Continue tamsulosin 0.4 mg daily -We discussed undergoing a bladder outlet procedure, but he would like to defer this until he returns in 1 month to see if the doxycycline improves his symptoms  No follow-ups on file.  These notes generated with voice recognition software. I apologize for typographical errors.  [name]  [facility] [address]  [address]  [address] [place], [place] [address] [phone]

## 2023-11-21 ENCOUNTER — Ambulatory Visit: Payer: Medicare Other | Admitting: Urology

## 2023-11-21 ENCOUNTER — Encounter: Payer: Self-pay | Admitting: Urology

## 2023-11-21 VITALS — BP 146/78 | HR 99 | Ht 65.0 in | Wt 148.0 lb

## 2023-11-21 DIAGNOSIS — N451 Epididymitis: Secondary | ICD-10-CM | POA: Diagnosis not present

## 2023-11-21 DIAGNOSIS — N401 Enlarged prostate with lower urinary tract symptoms: Secondary | ICD-10-CM

## 2023-11-21 DIAGNOSIS — N419 Inflammatory disease of prostate, unspecified: Secondary | ICD-10-CM | POA: Diagnosis not present

## 2023-11-21 MED ORDER — AMOXICILLIN-POT CLAVULANATE 875-125 MG PO TABS: 1.0000 | ORAL_TABLET | Freq: Two times a day (BID) | ORAL | 0 refills | Status: DC

## 2023-12-26 ENCOUNTER — Encounter: Payer: Self-pay | Admitting: Urology

## 2023-12-26 ENCOUNTER — Ambulatory Visit: Admitting: Urology

## 2023-12-26 VITALS — BP 143/79 | HR 89 | Ht 66.0 in | Wt 149.0 lb

## 2023-12-26 DIAGNOSIS — N529 Male erectile dysfunction, unspecified: Secondary | ICD-10-CM

## 2023-12-26 DIAGNOSIS — N451 Epididymitis: Secondary | ICD-10-CM

## 2023-12-26 DIAGNOSIS — N419 Inflammatory disease of prostate, unspecified: Secondary | ICD-10-CM

## 2023-12-26 DIAGNOSIS — N401 Enlarged prostate with lower urinary tract symptoms: Secondary | ICD-10-CM

## 2023-12-26 LAB — BLADDER SCAN AMB NON-IMAGING

## 2023-12-26 MED ORDER — CELECOXIB 100 MG PO CAPS
100.0000 mg | ORAL_CAPSULE | Freq: Two times a day (BID) | ORAL | 0 refills | Status: AC
Start: 2023-12-26 — End: 2024-01-09

## 2023-12-26 NOTE — Progress Notes (Signed)
 12/26/2023 1:55 PM   Micheal Carr Oct 14, 1955 [id number]  Referring provider: Lyle San, MD [address] [place],  [place] [address]  Urological history: 1.  Elevated PSA - Biopsy (04/2020), iPSA 9.17, 39 gram prostate -benign prostate tissue  2.  BPH with LU TS - PSA (05/20240 2.7  - Tamsulosin  0.4 mg daily  Chief Complaint  Patient presents with   Prostatitis        HPI: Micheal Carr is a 68 y.o. man who presents today for a 1 month follow-up for recheck on prostatitis and right epididymitis.    Previous records reviewed.   At his visit on November 21, 2023 with me, he had an intolerance to the doxycycline  and switched to Augmentin  for 30 days.  He states he feels 90% better, but he still has pain in about 20% of the right testicle.  He states that his tender when it is touched and he is more aware of it.  He also states that the trouble with urination is also abated with the completion of his antibiotics.  I PSS 13/3  PVR 0 mL   He is taking his tamsulosin  0.4 mg a day.  Patient denies any modifying or aggravating factors.  Patient denies any recent UTI's, gross hematuria, dysuria or suprapubic/flank pain.  Patient denies any fevers, chills, nausea or vomiting.    He also has been having difficulty achieving an erection that is firm enough for penetration for the last 2 years.  He has not tried any treatment for this.  He states about 6 months ago he noticed he was no longer getting spontaneous erections.  He denied any pain with erections.  He states he does have a slight curve with erections, but it did not interfere with intercourse.   IPSS     Row Name 12/26/23 1300         International Prostate Symptom Score   How often have you had the sensation of not emptying your bladder? Not at All     How often have you had to urinate less than every two hours? About half the time     How often have you found you stopped and started  again several times when you urinated? Almost always     How often have you found it difficult to postpone urination? Less than half the time     How often have you had a weak urinary stream? Less than 1 in 5 times     How many times did you typically get up at night to urinate? 2 Times     Total IPSS Score 13       Quality of Life due to urinary symptoms   If you were to spend the rest of your life with your urinary condition just the way it is now how would you feel about that? Mixed            Score:  1-7 Mild 8-19 Moderate 20-35 Severe   PMH: Past Medical History:  Diagnosis Date   Asthma    Chronic sinus infection    Heart murmur    History of kidney stones     Surgical History: Past Surgical History:  Procedure Laterality Date   COLONOSCOPY     COLONOSCOPY WITH PROPOFOL  N/A 03/05/2021   Procedure: COLONOSCOPY WITH PROPOFOL ;  Surgeon: Shane Darling, MD;  Location: [facility] ENDOSCOPY;  Service: Endoscopy;  Laterality: N/A;   FUNCTIONAL ENDOSCOPIC SINUS  SURGERY     NASAL SEPTUM SURGERY     TONSILLECTOMY     trigger thumb Right     Home Medications:  Allergies as of 12/26/2023       Reactions   Doxycycline  Other (See Comments)   Patient experienced headache, tunnel vision and peripheral scintillating scotomas   Levofloxacin Other (See Comments)   Patient states his ENT gave him this med and he had "a little something kind of reaction" and was told not to take it again.   Sulfa Antibiotics Itching        Medication List        Accurate as of December 26, 2023  1:55 PM. If you have any questions, ask your nurse or doctor.          STOP taking these medications    amoxicillin -clavulanate 875-125 MG tablet Commonly known as: AUGMENTIN        TAKE these medications    albuterol  108 (90 Base) MCG/ACT inhaler Commonly known as: VENTOLIN  HFA Inhale into the lungs.   ascorbic Acid 500 MG Cpcr Commonly known as: VITAMIN C Take 1,000 mg by mouth 2 (two)  times daily.   budesonide-formoterol 160-4.5 MCG/ACT inhaler Commonly known as: SYMBICORT Inhale 2 puffs into the lungs in the morning and at bedtime.   celecoxib 100 MG capsule Commonly known as: CeleBREX Take 1 capsule (100 mg total) by mouth 2 (two) times daily for 14 days.   cetirizine 10 MG tablet Commonly known as: ZYRTEC Take 10 mg by mouth daily.   Garlic 100 MG Tabs Take by mouth.   montelukast  10 MG tablet Commonly known as: SINGULAIR  TAKE 1 TABLET BY MOUTH EVERY DAY   Multi-Vitamin tablet Take 1 tablet by mouth daily.   omeprazole 40 MG capsule Commonly known as: PRILOSEC Take 40 mg by mouth daily.   pseudoephedrine 120 MG 12 hr tablet Commonly known as: SUDAFED Take 120 mg by mouth at bedtime.   tamsulosin  0.4 MG Caps capsule Commonly known as: FLOMAX  Take 2 capsules once daily   Vitamin D3 125 MCG (5000 UT) Caps Take by mouth.   Xhance 93 MCG/ACT Exhu Generic drug: Fluticasone Propionate Place into the nose.        Allergies:  Allergies  Allergen Reactions   Doxycycline  Other (See Comments)    Patient experienced headache, tunnel vision and peripheral scintillating scotomas   Levofloxacin Other (See Comments)    Patient states his ENT gave him this med and he had "a little something kind of reaction" and was told not to take it again.    Sulfa Antibiotics Itching    Family History: Family History  Problem Relation Age of Onset   Alzheimer's disease Mother    Kidney disease Mother    Cancer Mother    Heart disease Mother    Dementia Father    Stroke Father     Social History:  reports that he has never smoked. He has never used smokeless tobacco. He reports that he does not drink alcohol and does not use drugs.  ROS: Pertinent ROS in HPI  Physical Exam: BP (!) 143/79   Pulse 89   Ht 5\' 6"  (1.676 m)   Wt 149 lb (67.6 kg)   BMI 24.05 kg/m   Constitutional:  Well nourished. Alert and oriented, No acute distress. HEENT: [place] AT,  moist mucus membranes.  Trachea midline Cardiovascular: No clubbing, cyanosis, or edema. Respiratory: Normal respiratory effort, no increased work of breathing. GU:  Scrotum  without lesions, cysts, rashes and/or edema.  Testicles are located scrotally bilaterally. No masses are appreciated in the testicles. Left and right epididymis are normal.  The right epididymis is slightly enlarged when compared to the left epididymis and slightly tender with palpation. Neurologic: Grossly intact, no focal deficits, moving all 4 extremities. Psychiatric: Normal mood and affect.  Laboratory Data: N/A  Pertinent Imaging:  12/26/23 13:27  Scan Result 0ml   Assessment & Plan:    1. Prostatitis, unspecified prostatitis type (Primary) - Resolved  2.  Epididymitis - Some residual discomfort in the right epididymis - Start Celebrex 100 mg twice daily for 2 weeks  3. BPH with LUTS - BLADDER SCAN AMB NON-IMAGING, demonstrates complete bladder emptying  4.  Erectile dysfunction - Will go ahead with checking a morning testosterone per AUA guidelines - We discussed PDE 5 inhibitors as he does not take nitroglycerin products and has not tried them in the past, he would like to see what his morning testosterone level returns as before he makes that decision - If his morning testosterone level returns below 300, he will need a repeat serum testosterone level before 10 AM at least 2 days later for confirmation  Return for Pending testosterone results.  These notes generated with voice recognition software. I apologize for typographical errors.  Briant Camper  [facility] [address]  [address] [place], [place] [address] [phone]

## 2024-01-10 ENCOUNTER — Other Ambulatory Visit

## 2024-01-11 ENCOUNTER — Other Ambulatory Visit

## 2024-01-11 DIAGNOSIS — N529 Male erectile dysfunction, unspecified: Secondary | ICD-10-CM

## 2024-01-12 ENCOUNTER — Ambulatory Visit: Payer: Self-pay | Admitting: Urology

## 2024-01-12 LAB — TESTOSTERONE: Testosterone: 320 ng/dL (ref 264–916)

## 2024-01-29 ENCOUNTER — Other Ambulatory Visit: Payer: Self-pay

## 2024-01-29 DIAGNOSIS — Z87898 Personal history of other specified conditions: Secondary | ICD-10-CM

## 2024-01-29 DIAGNOSIS — N401 Enlarged prostate with lower urinary tract symptoms: Secondary | ICD-10-CM

## 2024-01-30 LAB — PSA: Prostate Specific Ag, Serum: 2.6 ng/mL (ref 0.0–4.0)

## 2024-02-01 ENCOUNTER — Encounter: Payer: Self-pay | Admitting: Urology

## 2024-02-01 ENCOUNTER — Ambulatory Visit: Payer: Self-pay | Admitting: Urology

## 2024-02-01 VITALS — BP 148/72 | HR 101 | Ht 66.0 in | Wt 148.0 lb

## 2024-02-01 DIAGNOSIS — N5082 Scrotal pain: Secondary | ICD-10-CM

## 2024-02-01 DIAGNOSIS — Z87898 Personal history of other specified conditions: Secondary | ICD-10-CM | POA: Diagnosis not present

## 2024-02-01 DIAGNOSIS — N401 Enlarged prostate with lower urinary tract symptoms: Secondary | ICD-10-CM

## 2024-02-01 NOTE — Progress Notes (Signed)
 I, Maysun L Gibbs,acting as a scribe for Micheal Knapp, MD.,have documented all relevant documentation on the behalf of Micheal Knapp, MD,as directed by  Micheal Knapp, MD while in the presence of Micheal Knapp, MD.  02/03/2024 12:53 PM   Micheal Carr 22-Jul-1956 [id number]  Referring provider: Lyle San, MD [address] [facility] [place],  [place] [address]  Chief Complaint  Patient presents with   Elevated PSA   Urologic history: 1.  Elevated PSA Biopsy 04/2020 PSA 9.17; 39 g prostate Pathology benign prostate tissue   2.  BPH with LUTS Tamsulosin  0.4 mg daily  HPI: Micheal Carr is a 68 y.o. male presents for annual follow-up.   Since his visit last June he has had 2 visits for scrotal pain and lower urinary tract symptoms.  He had been seen at [facility] and was treated for epididymitis.  He had an appointment with Matilde Son March 2025 and noted to have an indurated epididymis on exam and was treated with additional antibiotics.  He had improved on a follow-up visit and was started on an NSAID Since that last visit his scrotal pain has significantly improved and he describes as a dull ache.  He also has low back pain. Stable lower urinary tract symptoms on tamsulosin  Denies dysuria or gross hematuria PSA 01/29/2024 stable at 2.6  PMH: Past Medical History:  Diagnosis Date   Asthma    Chronic sinus infection    Heart murmur    History of kidney stones     Surgical History: Past Surgical History:  Procedure Laterality Date   COLONOSCOPY     COLONOSCOPY WITH PROPOFOL  N/A 03/05/2021   Procedure: COLONOSCOPY WITH PROPOFOL ;  Surgeon: Shane Darling, MD;  Location: [facility] ENDOSCOPY;  Service: Endoscopy;  Laterality: N/A;   FUNCTIONAL ENDOSCOPIC SINUS SURGERY     NASAL SEPTUM SURGERY     TONSILLECTOMY     trigger thumb Right     Home Medications:  Allergies as of 02/01/2024       Reactions   Doxycycline  Other (See Comments)    Patient experienced headache, tunnel vision and peripheral scintillating scotomas   Levofloxacin Other (See Comments)   Patient states his ENT gave him this med and he had "a little something kind of reaction" and was told not to take it again.   Sulfa Antibiotics Itching        Medication List        Accurate as of February 01, 2024 12:53 PM. If you have any questions, ask your nurse or doctor.          albuterol  108 (90 Base) MCG/ACT inhaler Commonly known as: VENTOLIN  HFA Inhale into the lungs.   ascorbic Acid 500 MG Cpcr Commonly known as: VITAMIN C Take 1,000 mg by mouth 2 (two) times daily.   budesonide-formoterol 160-4.5 MCG/ACT inhaler Commonly known as: SYMBICORT Inhale 2 puffs into the lungs in the morning and at bedtime.   cetirizine 10 MG tablet Commonly known as: ZYRTEC Take 10 mg by mouth daily.   Garlic 100 MG Tabs Take by mouth.   montelukast  10 MG tablet Commonly known as: SINGULAIR  TAKE 1 TABLET BY MOUTH EVERY DAY   Multi-Vitamin tablet Take 1 tablet by mouth daily.   omeprazole 40 MG capsule Commonly known as: PRILOSEC Take 40 mg by mouth daily.   pseudoephedrine 120 MG 12 hr tablet Commonly known as: SUDAFED Take 120 mg by mouth at bedtime.  tamsulosin  0.4 MG Caps capsule Commonly known as: FLOMAX  Take 2 capsules once daily   Vitamin D3 125 MCG (5000 UT) Caps Take by mouth.   Xhance 93 MCG/ACT Exhu Generic drug: Fluticasone Propionate Place into the nose.        Allergies:  Allergies  Allergen Reactions   Doxycycline  Other (See Comments)    Patient experienced headache, tunnel vision and peripheral scintillating scotomas   Levofloxacin Other (See Comments)    Patient states his ENT gave him this med and he had "a little something kind of reaction" and was told not to take it again.    Sulfa Antibiotics Itching    Family History: Family History  Problem Relation Age of Onset   Alzheimer's disease Mother    Kidney disease  Mother    Cancer Mother    Heart disease Mother    Dementia Father    Stroke Father     Social History:  reports that he has never smoked. He has never used smokeless tobacco. He reports that he does not drink alcohol and does not use drugs.   Physical Exam: BP (!) 148/72   Pulse (!) 101   Ht 5\' 6"  (1.676 m)   Wt 148 lb (67.1 kg)   BMI 23.89 kg/m   Constitutional:  Alert and oriented, No acute distress. HEENT: [place] AT Respiratory: Normal respiratory effort, no increased work of breathing. GU: Testes descended bilaterally without masses or tenderness.  No induration right epididymis appreciated on today's exam.  Prostate 40 grams, flat, smooth without nodules.  + Pelvic floor tenderness L >R Psychiatric: Normal mood and affect.   Assessment & Plan:    1. History elevated PSA Benign DRE PSA stable, 2.7 Continue annual follow-up  2. BPH with LUTS  Stable Continue tamsulosin   3.  Chronic scrotal pain We discussed possibility of chronic prostatitis/chronic pelvic pain syndrome + Pelvic floor tenderness on exam and pelvic floor physical therapy was discussed Trial of low-dose amitriptyline was also discussed Presently he states his symptoms are not bothersome enough that he desires further treatment 1 year follow-up scheduled and instructed call earlier for recurrent/worsening symptoms    Micheal Knapp, MD  [facility] [address] [place], [place] [address] [phone]

## 2024-04-27 ENCOUNTER — Other Ambulatory Visit: Payer: Self-pay | Admitting: Urology

## 2024-04-27 DIAGNOSIS — N401 Enlarged prostate with lower urinary tract symptoms: Secondary | ICD-10-CM

## 2024-06-18 ENCOUNTER — Emergency Department
Admission: EM | Admit: 2024-06-18 | Discharge: 2024-06-18 | Disposition: A | Discharge status: Home / Self Care | Attending: Emergency Medicine | Admitting: Emergency Medicine

## 2024-06-18 ENCOUNTER — Other Ambulatory Visit: Payer: Self-pay

## 2024-06-18 ENCOUNTER — Emergency Department

## 2024-06-18 DIAGNOSIS — R002 Palpitations: Secondary | ICD-10-CM | POA: Diagnosis present

## 2024-06-18 DIAGNOSIS — Z7901 Long term (current) use of anticoagulants: Secondary | ICD-10-CM | POA: Diagnosis not present

## 2024-06-18 DIAGNOSIS — I4891 Unspecified atrial fibrillation: Principal | ICD-10-CM | POA: Insufficient documentation

## 2024-06-18 LAB — COMPREHENSIVE METABOLIC PANEL WITH GFR
ALT: 26 U/L (ref 0–44)
AST: 27 U/L (ref 15–41)
Albumin: 4.5 g/dL (ref 3.5–5.0)
Alkaline Phosphatase: 86 U/L (ref 38–126)
Anion gap: 9 (ref 5–15)
BUN: 16 mg/dL (ref 8–23)
CO2: 25 mmol/L (ref 22–32)
Calcium: 9.6 mg/dL (ref 8.9–10.3)
Chloride: 106 mmol/L (ref 98–111)
Creatinine, Ser: 1.07 mg/dL (ref 0.61–1.24)
GFR, Estimated: >60 mL/min (ref >60)
Glucose, Bld: 148 mg/dL — ABNORMAL HIGH (ref 70–99)
Potassium: 4.4 mmol/L (ref 3.5–5.1)
Sodium: 140 mmol/L (ref 135–145)
Total Bilirubin: 0.8 mg/dL (ref 0.0–1.2)
Total Protein: 7.5 g/dL (ref 6.5–8.1)

## 2024-06-18 LAB — CBC
HCT: 47.1 % (ref 39.0–52.0)
Hemoglobin: 16 g/dL (ref 13.0–17.0)
MCH: 29.3 pg (ref 26.0–34.0)
MCHC: 34 g/dL (ref 30.0–36.0)
MCV: 86.3 fL (ref 80.0–100.0)
Platelets: 296 K/uL (ref 150–400)
RBC: 5.46 MIL/uL (ref 4.22–5.81)
RDW: 12.7 % (ref 11.5–15.5)
WBC: 11.9 K/uL — ABNORMAL HIGH (ref 4.0–10.5)
nRBC: 0 % (ref 0.0–0.2)

## 2024-06-18 LAB — TROPONIN I (HIGH SENSITIVITY)
Troponin I (High Sensitivity): 7 ng/L (ref <18)
Troponin I (High Sensitivity): 13 ng/L (ref <18)

## 2024-06-18 LAB — TSH: TSH: 0.926 u[IU]/mL (ref 0.350–4.500)

## 2024-06-18 LAB — MAGNESIUM: Magnesium: 2.3 mg/dL (ref 1.7–2.4)

## 2024-06-18 MED ORDER — DILTIAZEM HCL 25 MG/5ML IV SOLN
20.0000 mg | Freq: Once | INTRAVENOUS | Status: AC
  Administered 2024-06-18: 20 mg via INTRAVENOUS
  Filled 2024-06-18: qty 5

## 2024-06-18 MED ORDER — ONDANSETRON HCL 4 MG PO TABS: 4.0000 mg | ORAL_TABLET | Freq: Four times a day (QID) | ORAL | Status: DC | PRN

## 2024-06-18 MED ORDER — APIXABAN 5 MG PO TABS
5.0000 mg | ORAL_TABLET | Freq: Two times a day (BID) | ORAL | 2 refills | Status: AC
Start: 2024-06-18 — End: 2024-07-18

## 2024-06-18 MED ORDER — ENOXAPARIN SODIUM 40 MG/0.4ML IJ SOSY: 40.0000 mg | PREFILLED_SYRINGE | INTRAMUSCULAR | Status: DC

## 2024-06-18 MED ORDER — MAGNESIUM HYDROXIDE 400 MG/5ML PO SUSP: 30.0000 mL | Freq: Every day | ORAL | Status: DC | PRN

## 2024-06-18 MED ORDER — TRAZODONE HCL 50 MG PO TABS: 25.0000 mg | ORAL_TABLET | Freq: Every evening | ORAL | Status: DC | PRN

## 2024-06-18 MED ORDER — LACTATED RINGERS IV BOLUS
500.0000 mL | Freq: Once | INTRAVENOUS | Status: AC
  Administered 2024-06-18: 500 mL via INTRAVENOUS

## 2024-06-18 MED ORDER — ONDANSETRON HCL 4 MG/2ML IJ SOLN: 4.0000 mg | Freq: Four times a day (QID) | INTRAMUSCULAR | Status: DC | PRN

## 2024-06-18 MED ORDER — SODIUM CHLORIDE 0.9 % IV SOLN: INTRAVENOUS | Status: DC

## 2024-06-18 MED ORDER — DILTIAZEM HCL 60 MG PO TABS
60.0000 mg | ORAL_TABLET | Freq: Once | ORAL | Status: AC
  Administered 2024-06-18: 60 mg via ORAL
  Filled 2024-06-18: qty 1

## 2024-06-18 MED ORDER — DILTIAZEM HCL 60 MG PO TABS: 60.0000 mg | ORAL_TABLET | Freq: Three times a day (TID) | ORAL | 11 refills | Status: AC

## 2024-06-18 MED ORDER — METOPROLOL TARTRATE 5 MG/5ML IV SOLN
5.0000 mg | Freq: Once | INTRAVENOUS | Status: DC
  Filled 2024-06-18: qty 5

## 2024-06-18 MED ORDER — ACETAMINOPHEN 325 MG RE SUPP: 650.0000 mg | Freq: Four times a day (QID) | RECTAL | Status: DC | PRN

## 2024-06-18 MED ORDER — ACETAMINOPHEN 325 MG PO TABS: 650.0000 mg | ORAL_TABLET | Freq: Four times a day (QID) | ORAL | Status: DC | PRN

## 2024-06-18 MED ORDER — METOPROLOL TARTRATE 25 MG PO TABS
25.0000 mg | ORAL_TABLET | Freq: Once | ORAL | Status: DC
  Filled 2024-06-18: qty 1

## 2024-06-18 NOTE — ED Triage Notes (Signed)
 Pt to ED via POV for c/o irregular heart rhythm that began about an hour ago. Pt states chest has ached for the past 15-20 minutes. Pt sees cardiologist regularly. Pt not on blood thinners. Pt states he had an echocardiogram recently.

## 2024-06-18 NOTE — ED Provider Triage Note (Signed)
 Emergency Medicine Provider Triage Evaluation Note  Micheal Carr , a 68 y.o. male  was evaluated in triage.  Pt complains of feeling like his heart is racing and irregular, going up and down x 1 hour. Chest ached for past 15-20 minutes. No blood thinners. Does have hx of asthma. Sees cardiologist regularly with recent echo. Started prednisone  recently for leg/ankle/foot (?) injury. No other complaints.   Physical Exam  BP (!) 143/100   Pulse (!) 55   Temp 98.7 F (37.1 C) (Oral)   Resp 16   Ht 5' 5 (1.651 m)   Wt 68 kg   SpO2 98%   BMI 24.96 kg/m  Gen:   Awake, no distress   Resp:  Normal effort  MSK:   Moves extremities without difficulty  Other:  HR 55 bpm.  Medical Decision Making  Medically screening exam initiated at 3:39 PM.  Appropriate orders placed.  Micheal Carr was informed that the remainder of the evaluation will be completed by another provider, this initial triage assessment does not replace that evaluation, and the importance of remaining in the ED until their evaluation is complete.  Labs, EKG, chest x-ray ordered.   Sheron [employer], [place] 06/18/24 [phone]

## 2024-06-18 NOTE — ED Provider Notes (Addendum)
 [facility] Provider Note    Event Date/Time   First MD Initiated Contact with Patient 06/18/24 1601     (approximate)   History   Palpitations   HPI  Micheal Carr is a 68 y.o. male who presents to the ED for evaluation of Palpitations   Review a [facility] cardiology clinic visit from May.  History of moderate mitral regurgitation, frequent PVCs and a nonischemic cardiomyopathy with mild reduced EF.  Moderate persistent asthma on Pulmicort  Patient presents alongside his wife and 2 sons for evaluation of palpitations, dizziness and weakness over the past few hours.  Symptoms started at 2 PM.  He was walking outside, had a coughing fit and symptoms that been present since then.  Brief episode of dizziness without syncope or falls.   Physical Exam   Triage Vital Signs: ED Triage Vitals  Encounter Vitals Group     BP 06/18/24 1533 (!) 143/100     Girls Systolic BP Percentile --      Girls Diastolic BP Percentile --      Boys Systolic BP Percentile --      Boys Diastolic BP Percentile --      Pulse Rate 06/18/24 1533 (!) 55     Resp 06/18/24 1533 16     Temp 06/18/24 1533 98.7 F (37.1 C)     Temp Source 06/18/24 1533 Oral     SpO2 06/18/24 1533 98 %     Weight 06/18/24 1538 150 lb (68 kg)     Height 06/18/24 1538 5' 5 (1.651 m)     Head Circumference --      Peak Flow --      Pain Score 06/18/24 1534 3     Pain Loc --      Pain Education --      Exclude from Growth Chart --     Most recent vital signs: Vitals:   06/18/24 2030 06/18/24 2043  BP: 118/68   Pulse: 85   Resp: 19   Temp:  98.9 F (37.2 C)  SpO2: 99%     General: Awake, no distress.  CV:  Good peripheral perfusion.  Resp:  Normal effort.  Abd:  No distention.  MSK:  No deformity noted.  Neuro:  No focal deficits appreciated. Other:     ED Results / Procedures / Treatments   Labs (all labs ordered are listed, but only abnormal results are displayed) Labs  Reviewed  CBC - Abnormal; Notable for the following components:      Result Value   WBC 11.9 (*)    All other components within normal limits  COMPREHENSIVE METABOLIC PANEL WITH GFR - Abnormal; Notable for the following components:   Glucose, Bld 148 (*)    All other components within normal limits  MAGNESIUM  HIV ANTIBODY (ROUTINE TESTING W REFLEX)  BASIC METABOLIC PANEL WITH GFR  CBC  TSH  TROPONIN I (HIGH SENSITIVITY)  TROPONIN I (HIGH SENSITIVITY)    EKG A-fib with RVR with rate of 155 bpm, no STEMI.  Nonspecific ST changes.  RADIOLOGY CXR interpreted by me without evidence of acute cardiopulmonary pathology.  Official radiology report(s): DG Chest 2 View Result Date: 06/18/2024 CLINICAL DATA:  Heart palpitations, arrhythmia EXAM: CHEST - 2 VIEW COMPARISON:  03/26/2021 FINDINGS: The heart size and mediastinal contours are within normal limits. Both lungs are clear. The visualized skeletal structures are unremarkable. IMPRESSION: No active cardiopulmonary disease. Electronically Signed   By: Ozell Daring  M.D.   On: 06/18/2024 16:25    PROCEDURES and INTERVENTIONS:  .Critical Care  Performed by: Claudene Rover, MD Authorized by: Claudene Rover, MD   Critical care provider statement:    Critical care time (minutes):  75   Critical care time was exclusive of:  Separately billable procedures and treating other patients   Critical care was necessary to treat or prevent imminent or life-threatening deterioration of the following conditions:  Cardiac failure and circulatory failure   Critical care was time spent personally by me on the following activities:  Development of treatment plan with patient or surrogate, discussions with consultants, evaluation of patient's response to treatment, examination of patient, ordering and review of laboratory studies, ordering and review of radiographic studies, ordering and performing treatments and interventions, pulse oximetry, re-evaluation  of patient's condition and review of old charts .1-3 Lead EKG Interpretation  Performed by: Claudene Rover, MD Authorized by: Claudene Rover, MD     Interpretation: abnormal     ECG rate:  141   ECG rate assessment: tachycardic     Rhythm: atrial fibrillation     Ectopy: none     Conduction: normal     Medications  enoxaparin (LOVENOX) injection 40 mg (has no administration in time range)  0.9 %  sodium chloride  infusion (has no administration in time range)  acetaminophen (TYLENOL) tablet 650 mg (has no administration in time range)    Or  acetaminophen (TYLENOL) suppository 650 mg (has no administration in time range)  traZODone (DESYREL) tablet 25 mg (has no administration in time range)  magnesium hydroxide (MILK OF MAGNESIA) suspension 30 mL (has no administration in time range)  ondansetron (ZOFRAN) tablet 4 mg (has no administration in time range)    Or  ondansetron (ZOFRAN) injection 4 mg (has no administration in time range)  lactated ringers bolus 500 mL (0 mLs Intravenous Stopped 06/18/24 1857)  diltiazem (CARDIZEM) tablet 60 mg (60 mg Oral Given 06/18/24 1815)  diltiazem (CARDIZEM) injection 20 mg (20 mg Intravenous Given 06/18/24 1815)     IMPRESSION / MDM / ASSESSMENT AND PLAN / ED COURSE  I reviewed the triage vital signs and the nursing notes.  Differential diagnosis includes, but is not limited to, ACS, PTX, PNA, muscle strain/spasm, PE, dissection, anxiety, pleural effusion  {Patient presents with symptoms of an acute illness or injury that is potentially life-threatening.  Patient presents with evidence of new onset A-fib, with A-fib with RVR, requiring medical admission.  Heart rates 140s-160s, improving with IV and oral diltiazem and small IV fluid bolus.  Remains in A-fib though with these slower rates.   Due to his history of moderate persistent asthma, avoided beta-blockers and use calcium channel blockers.  Blood pressure softening out alongside improved  rate control.  Normal electrolytes and CBC.  Clear CXR without evidence of volume overload.    Clinical Course as of 06/18/24 2100  Tue Jun 18, 2024  1854 Reassessed, some mild improvement of symptoms but they persist.  He remains in A-fib but rates are improved.  While we had discussed the possibility of outpatient management I recommend medical admission and he is agreeable.  Will consult medicine [DS]  1928 Consulted medicine who agrees to admit [DS]    Clinical Course User Index [DS] Claudene Rover, MD   After I had previously discussed with medicine for admission and they were starting to place orders, and receive repeat EKG for nursing staff with patient in a normal sinus rhythm, he has converted  out of A-fib.  So I go reevaluate the patient and he reports resolution of symptoms, feeling well and requesting discharge.  I circled back and discussed with the admitting hospitalist who will remove their orders and we will discharge the patient with outpatient cardiology follow-up.   I again returned the bedside and have long discussion with the patient regarding medications for A-fib.  We discussed rate control with calcium channel blocker.  Discussed benefits of short acting versus long-acting diltiazem and he would prefer to have short acting.  We discussed stroke prevention and A-fib and my recommendation to start anticoagulation.  We discussed Eliquis and anticoagulation precautions.  We discussed ED return precautions, following up with cardiology.  Answered his questions and family's questions to the best of my ability.  FINAL CLINICAL IMPRESSION(S) / ED DIAGNOSES   Final diagnoses:  New onset atrial fibrillation (HCC)  Atrial fibrillation with RVR (HCC)     Rx / DC Orders   ED Discharge Orders          Ordered    diltiazem (CARDIZEM) 60 MG tablet  3 times daily        06/18/24 2046    apixaban (ELIQUIS) 5 MG TABS tablet  2 times daily        06/18/24 2046              Note:  This document was prepared using Dragon voice recognition software and may include unintentional dictation errors.   Claudene Rover, MD 06/18/24 PERRY    Claudene Rover, MD 06/18/24 2101

## 2024-06-18 NOTE — ED Notes (Signed)
 ED Provider at bedside.

## 2024-06-18 NOTE — ED Notes (Signed)
 Mansy MD made aware pt would like to speak with him regarding admission.

## 2024-06-18 NOTE — Discharge Instructions (Addendum)
 Diltiazem/Cardizem 3 times daily every day, this controls heart rate, blood pressure and reduces likelihood of A-fib  Eliquis twice daily every day to prevent stroke  Follow-up with your cardiologist ASAP  Keep track of your blood pressures routinely while starting the diltiazem - if you develop dizziness, passing out then please stop this medication or at least reduce the dosing by half

## 2025-01-29 ENCOUNTER — Other Ambulatory Visit

## 2025-01-31 ENCOUNTER — Ambulatory Visit: Admitting: Urology
